# Patient Record
Sex: Female | Born: 1945
Health system: Southern US, Community
[De-identification: ages and names within clinical notes are randomized; demographics above are authoritative.]

## PROBLEM LIST (undated history)

## (undated) DIAGNOSIS — E785 Hyperlipidemia, unspecified: Secondary | ICD-10-CM

## (undated) DIAGNOSIS — M810 Age-related osteoporosis without current pathological fracture: Secondary | ICD-10-CM

## (undated) DIAGNOSIS — E039 Hypothyroidism, unspecified: Secondary | ICD-10-CM

## (undated) HISTORY — PX: WISDOM TOOTH EXTRACTION: SHX21

---

## 2006-07-03 ENCOUNTER — Encounter: Admission: RE | Admit: 2006-07-03 | Discharge: 2006-07-03 | Payer: Self-pay | Admitting: Internal Medicine

## 2007-07-08 ENCOUNTER — Encounter: Admission: RE | Admit: 2007-07-08 | Discharge: 2007-07-08 | Payer: Self-pay | Admitting: Internal Medicine

## 2008-07-12 ENCOUNTER — Encounter: Admission: RE | Admit: 2008-07-12 | Discharge: 2008-07-12 | Payer: Self-pay | Admitting: Obstetrics and Gynecology

## 2008-07-20 ENCOUNTER — Encounter: Admission: RE | Admit: 2008-07-20 | Discharge: 2008-07-20 | Payer: Self-pay | Admitting: Obstetrics and Gynecology

## 2010-08-06 ENCOUNTER — Encounter: Payer: Self-pay | Admitting: Obstetrics and Gynecology

## 2011-10-03 ENCOUNTER — Ambulatory Visit (HOSPITAL_COMMUNITY)
Admission: RE | Admit: 2011-10-03 | Discharge: 2011-10-03 | Disposition: A | Discharge status: Home / Self Care | Payer: BC Managed Care – PPO | Source: Ambulatory Visit | Attending: Internal Medicine | Admitting: Internal Medicine

## 2011-10-03 DIAGNOSIS — R0602 Shortness of breath: Secondary | ICD-10-CM | POA: Insufficient documentation

## 2011-10-03 MED ORDER — ALBUTEROL SULFATE (5 MG/ML) 0.5% IN NEBU
2.5000 mg | INHALATION_SOLUTION | Freq: Once | RESPIRATORY_TRACT | Status: AC
  Administered 2011-10-03: 2.5 mg via RESPIRATORY_TRACT

## 2013-10-13 ENCOUNTER — Other Ambulatory Visit: Payer: Self-pay | Admitting: Internal Medicine

## 2013-10-13 DIAGNOSIS — Z1231 Encounter for screening mammogram for malignant neoplasm of breast: Secondary | ICD-10-CM

## 2013-10-15 ENCOUNTER — Other Ambulatory Visit: Payer: Self-pay | Admitting: Internal Medicine

## 2013-10-15 DIAGNOSIS — R42 Dizziness and giddiness: Secondary | ICD-10-CM

## 2013-10-15 DIAGNOSIS — Z139 Encounter for screening, unspecified: Secondary | ICD-10-CM

## 2013-10-15 DIAGNOSIS — Z87891 Personal history of nicotine dependence: Secondary | ICD-10-CM

## 2013-10-22 ENCOUNTER — Ambulatory Visit: Payer: BC Managed Care – PPO

## 2013-10-23 ENCOUNTER — Other Ambulatory Visit: Payer: BC Managed Care – PPO

## 2013-10-23 ENCOUNTER — Ambulatory Visit
Admission: RE | Admit: 2013-10-23 | Discharge: 2013-10-23 | Disposition: A | Discharge status: Home / Self Care | Payer: Medicare Other | Source: Ambulatory Visit | Attending: Internal Medicine | Admitting: Internal Medicine

## 2013-10-23 DIAGNOSIS — Z139 Encounter for screening, unspecified: Secondary | ICD-10-CM

## 2013-10-23 DIAGNOSIS — Z87891 Personal history of nicotine dependence: Secondary | ICD-10-CM

## 2013-10-23 DIAGNOSIS — Z1231 Encounter for screening mammogram for malignant neoplasm of breast: Secondary | ICD-10-CM

## 2013-10-23 DIAGNOSIS — R42 Dizziness and giddiness: Secondary | ICD-10-CM

## 2014-10-14 ENCOUNTER — Other Ambulatory Visit: Payer: Self-pay | Admitting: Internal Medicine

## 2014-10-14 DIAGNOSIS — Z1231 Encounter for screening mammogram for malignant neoplasm of breast: Secondary | ICD-10-CM

## 2014-12-09 ENCOUNTER — Ambulatory Visit
Admission: RE | Admit: 2014-12-09 | Discharge: 2014-12-09 | Disposition: A | Discharge status: Home / Self Care | Payer: Medicare Other | Source: Ambulatory Visit | Attending: Internal Medicine | Admitting: Internal Medicine

## 2014-12-09 DIAGNOSIS — Z1231 Encounter for screening mammogram for malignant neoplasm of breast: Secondary | ICD-10-CM

## 2015-03-17 ENCOUNTER — Ambulatory Visit (INDEPENDENT_AMBULATORY_CARE_PROVIDER_SITE_OTHER): Payer: Medicare Other | Admitting: Sports Medicine

## 2015-03-17 ENCOUNTER — Encounter: Payer: Self-pay | Admitting: Sports Medicine

## 2015-03-17 VITALS — BP 92/47 | Ht 62.0 in | Wt 135.0 lb

## 2015-03-17 DIAGNOSIS — M25569 Pain in unspecified knee: Secondary | ICD-10-CM

## 2015-03-17 DIAGNOSIS — M7741 Metatarsalgia, right foot: Secondary | ICD-10-CM | POA: Diagnosis not present

## 2015-03-17 DIAGNOSIS — M25461 Effusion, right knee: Secondary | ICD-10-CM | POA: Diagnosis not present

## 2015-03-17 DIAGNOSIS — M25469 Effusion, unspecified knee: Secondary | ICD-10-CM | POA: Insufficient documentation

## 2015-03-17 DIAGNOSIS — M7742 Metatarsalgia, left foot: Secondary | ICD-10-CM | POA: Diagnosis not present

## 2015-03-17 NOTE — Progress Notes (Signed)
   HPI  CC: rt knee pain/buckling Patient is here w/ complaints of Rt knee pain and buckling. She states she has had pain for year in the affected knee but this worsened this winter. She also reports knee buckling. Buckling is not related to pain, and seems to but more of a "failing" as reported by the patient. She denies any injuries in the past, but has now experienced a handful of falls due to this buckling. Reports some clicking/popping/catching of the Rt knee as well as some nighttime pain.   Objective: BP 92/47 mmHg  Ht  (1.575 m)  Wt 135 lb (61.236 kg)  BMI 24.69 kg/m2 Gen: NAD, alert, cooperative Knee: Valgus presentation (R>L). Small effusion noted in Rt knee. No bony abnormalities. No Baker's cyst appreciated. Palpation normal with no warmth or joint line tenderness or patellar tenderness or condyle tenderness. No TTP along infrapatellar or pes anserine bursas. ROM in flexion of Right knee (135 degrees) and extension (0 degrees), and Left knee flexion (160 degrees) and extension (0 degrees) and lower leg rotation. Ligaments with solid consistent endpoints including ACL, PCL, LCL, MCL bilaterally.  Negative Anterior Drawer/Lachman/Pivot Shift. Negative Mcmurray's and Thessaly. Patellar and quadriceps tendons unremarkable. Hamstring and quadriceps strength significantly weak bilaterally. Right Knee [place]: suprapatellar compressible effusion present. Medial and lateral degenerative meniscal evidence present. Some osteophyte growth noted at the lateral joint line.  Neuro: Alert and oriented, Speech clear, No gross deficits  Assessment and plan:  Effusion of knee joint Patient's symptoms and US findings most consistent with degenerative meniscal changes and bony changes typical for her age group. Patient's bilateral weakness of the quadricepts and hamstring muscles is likely helping to contribute to some of this chronic irritation and pain. Small effusion present in affected knee is also a  likely contributor to her discomfort.  - Compressive sleeve provided today for Rt knee.  - Patient given quadricept strengthening exercises to be performed daily.  - Patient encouraged to ice and elevate when sore, but continue staying active.  - Arch support also provided.  Metatarsalgia of both feet Patient noted pain of the distal aspect of the 2nd/3rd metatarsals. This is relatively new onset. Some evidence noted of early signs of transverse arch breakdown. - Arch support provided today. - Patient is likely a good candidate for padding to be placed in the forefoot, in the future.     Kathee Delton, MD,MS,  PGY2 03/17/2015 1:10 PM

## 2015-03-17 NOTE — Assessment & Plan Note (Addendum)
Patient's symptoms and US findings most consistent with degenerative meniscal changes and bony changes typical for her age group. Patient's bilateral weakness of the quadricepts and hamstring muscles is likely helping to contribute to some of this chronic irritation and pain. Small effusion present in affected knee is also a likely contributor to her discomfort.  - Compressive sleeve provided today for Rt knee.  - Patient given quadricept strengthening exercises to be performed daily.  - Patient encouraged to ice and elevate when sore, but continue staying active.  - Arch support also provided.

## 2015-03-17 NOTE — Patient Instructions (Signed)
It was a pleasure seeing you today in our clinic. Today we discussed your knee pain. Here is the treatment plan we have discussed and agreed upon together:   - We would like to see you back in 2 months to track your progression. - In the mean time we would like you to begin a regular strengthening exercise program. These exercises were listed on the separate sheet provided to you during your visit. We ask that you perform these 3-4 times a day everyday.

## 2015-03-17 NOTE — Assessment & Plan Note (Signed)
Patient noted pain of the distal aspect of the 2nd/3rd metatarsals. This is relatively new onset. Some evidence noted of early signs of transverse arch breakdown. - Arch support provided today. - Patient is likely a good candidate for padding to be placed in the forefoot, in the future.

## 2015-03-23 ENCOUNTER — Ambulatory Visit: Payer: Medicare Other | Admitting: Sports Medicine

## 2015-05-17 ENCOUNTER — Encounter: Payer: Self-pay | Admitting: Sports Medicine

## 2015-05-17 ENCOUNTER — Ambulatory Visit (INDEPENDENT_AMBULATORY_CARE_PROVIDER_SITE_OTHER): Payer: Medicare Other | Admitting: Sports Medicine

## 2015-05-17 VITALS — BP 111/84 | HR 69

## 2015-05-17 DIAGNOSIS — M7742 Metatarsalgia, left foot: Secondary | ICD-10-CM

## 2015-05-17 DIAGNOSIS — M7741 Metatarsalgia, right foot: Secondary | ICD-10-CM

## 2015-05-17 DIAGNOSIS — M25461 Effusion, right knee: Secondary | ICD-10-CM

## 2015-05-17 NOTE — Assessment & Plan Note (Signed)
Improved with compression and with some home exercises  Keep up HEP  Use compression and can consider for left knee which has milder sxs  Reck 3 mos  Standing XRays if this worsens

## 2015-05-17 NOTE — Progress Notes (Signed)
Patient ID: Crystal Trujillo, female   DOB: 1945-12-30, 69 y.o.   MRN: [id number]  Patient works as a CMA She is RTC FU of RT knee pain and bilat forefoot pain Arch supports with scaphoid pad have helped feet Still with some forefoot pain  RT knee was giving out Since use of compression sleeve she feels this is 90% better Swelling is less More conficent walkign and steps  Still fears leading up steps with RT leg  ROS No swelling of great toe/ no swelling of other joints No recent medical ills Night pain in joints is rare  PEXAM NAD BP 111/84 mmHg  Pulse 69  RT Knee Mild effusion still present Has spurring medial joint line Improved flexion to 135 deg Ext is full Ligaments stable Strength moderately reduced quads Mod reduced hip abduction and flexion  Poin over lateral MT heads 3 to 5 RT and LT Drop of 4th MT head Loss of longitudinal arch bilat  Pronated gait on walking

## 2015-05-17 NOTE — Patient Instructions (Signed)
Try to do straight leg raises - do 2 sets of 8 to 10  And lateral leg raises - 2 sets of 8 to 10  Then isometrics - 5 repeats for count of 10  Try using compression on both knees  Test the metatarsal pads in your shoes - if helpful we should put in other shoes  See me in about 3 months

## 2015-05-17 NOTE — Assessment & Plan Note (Signed)
MT pads added to insoles  She felt comfortable in these  Scaphoid pad helps correct the pronation  Test these and we should add to all shoes if needed

## 2016-02-02 ENCOUNTER — Other Ambulatory Visit: Payer: Self-pay | Admitting: Internal Medicine

## 2016-02-02 DIAGNOSIS — Z1231 Encounter for screening mammogram for malignant neoplasm of breast: Secondary | ICD-10-CM

## 2016-02-09 ENCOUNTER — Ambulatory Visit
Admission: RE | Admit: 2016-02-09 | Discharge: 2016-02-09 | Disposition: A | Discharge status: Home / Self Care | Payer: Medicare Other | Source: Ambulatory Visit | Attending: Internal Medicine | Admitting: Internal Medicine

## 2016-02-09 DIAGNOSIS — Z1231 Encounter for screening mammogram for malignant neoplasm of breast: Secondary | ICD-10-CM

## 2016-03-17 ENCOUNTER — Encounter (HOSPITAL_COMMUNITY): Payer: Self-pay | Admitting: Emergency Medicine

## 2016-03-17 ENCOUNTER — Emergency Department (HOSPITAL_COMMUNITY): Payer: Medicare Other

## 2016-03-17 ENCOUNTER — Inpatient Hospital Stay (HOSPITAL_COMMUNITY)
Admission: EM | Admit: 2016-03-17 | Discharge: 2016-03-22 | DRG: 460 | Disposition: A | Discharge status: Skilled Nursing Facility | Payer: Medicare Other | Attending: Internal Medicine | Admitting: Internal Medicine

## 2016-03-17 DIAGNOSIS — S32030A Wedge compression fracture of third lumbar vertebra, initial encounter for closed fracture: Secondary | ICD-10-CM | POA: Diagnosis not present

## 2016-03-17 DIAGNOSIS — S32039A Unspecified fracture of third lumbar vertebra, initial encounter for closed fracture: Secondary | ICD-10-CM

## 2016-03-17 DIAGNOSIS — M810 Age-related osteoporosis without current pathological fracture: Secondary | ICD-10-CM | POA: Diagnosis present

## 2016-03-17 DIAGNOSIS — D62 Acute posthemorrhagic anemia: Secondary | ICD-10-CM | POA: Diagnosis not present

## 2016-03-17 DIAGNOSIS — W11XXXA Fall on and from ladder, initial encounter: Secondary | ICD-10-CM | POA: Diagnosis not present

## 2016-03-17 DIAGNOSIS — S32031A Stable burst fracture of third lumbar vertebra, initial encounter for closed fracture: Principal | ICD-10-CM | POA: Diagnosis present

## 2016-03-17 DIAGNOSIS — Z418 Encounter for other procedures for purposes other than remedying health state: Secondary | ICD-10-CM

## 2016-03-17 DIAGNOSIS — Z7983 Long term (current) use of bisphosphonates: Secondary | ICD-10-CM

## 2016-03-17 DIAGNOSIS — E039 Hypothyroidism, unspecified: Secondary | ICD-10-CM | POA: Diagnosis not present

## 2016-03-17 DIAGNOSIS — Z419 Encounter for procedure for purposes other than remedying health state, unspecified: Secondary | ICD-10-CM

## 2016-03-17 DIAGNOSIS — F419 Anxiety disorder, unspecified: Secondary | ICD-10-CM | POA: Diagnosis present

## 2016-03-17 DIAGNOSIS — K5900 Constipation, unspecified: Secondary | ICD-10-CM | POA: Diagnosis present

## 2016-03-17 DIAGNOSIS — D649 Anemia, unspecified: Secondary | ICD-10-CM | POA: Diagnosis present

## 2016-03-17 DIAGNOSIS — E785 Hyperlipidemia, unspecified: Secondary | ICD-10-CM | POA: Diagnosis present

## 2016-03-17 DIAGNOSIS — F329 Major depressive disorder, single episode, unspecified: Secondary | ICD-10-CM | POA: Diagnosis present

## 2016-03-17 DIAGNOSIS — Y92018 Other place in single-family (private) house as the place of occurrence of the external cause: Secondary | ICD-10-CM

## 2016-03-17 DIAGNOSIS — M62838 Other muscle spasm: Secondary | ICD-10-CM | POA: Diagnosis present

## 2016-03-17 DIAGNOSIS — E876 Hypokalemia: Secondary | ICD-10-CM | POA: Diagnosis present

## 2016-03-17 DIAGNOSIS — M5416 Radiculopathy, lumbar region: Secondary | ICD-10-CM | POA: Diagnosis present

## 2016-03-17 HISTORY — DX: Hyperlipidemia, unspecified: E78.5

## 2016-03-17 HISTORY — DX: Hypothyroidism, unspecified: E03.9

## 2016-03-17 HISTORY — DX: Age-related osteoporosis without current pathological fracture: M81.0

## 2016-03-17 LAB — CBC WITH DIFFERENTIAL/PLATELET
Basophils Absolute: 0 10*3/uL (ref 0.0–0.1)
Basophils Relative: 0 %
EOS ABS: 0 10*3/uL (ref 0.0–0.7)
EOS PCT: 0 %
HCT: 39.8 % (ref 36.0–46.0)
Hemoglobin: 13.1 g/dL (ref 12.0–15.0)
LYMPHS ABS: 1 10*3/uL (ref 0.7–4.0)
Lymphocytes Relative: 11 %
MCH: 30.5 pg (ref 26.0–34.0)
MCHC: 32.9 g/dL (ref 30.0–36.0)
MCV: 92.8 fL (ref 78.0–100.0)
MONOS PCT: 6 %
Monocytes Absolute: 0.6 10*3/uL (ref 0.1–1.0)
Neutro Abs: 8.2 10*3/uL — ABNORMAL HIGH (ref 1.7–7.7)
Neutrophils Relative %: 83 %
PLATELETS: 254 10*3/uL (ref 150–400)
RBC: 4.29 MIL/uL (ref 3.87–5.11)
RDW: 13.2 % (ref 11.5–15.5)
WBC: 9.9 10*3/uL (ref 4.0–10.5)

## 2016-03-17 LAB — BASIC METABOLIC PANEL
Anion gap: 5 (ref 5–15)
BUN: 14 mg/dL (ref 6–20)
CHLORIDE: 106 mmol/L (ref 101–111)
CO2: 24 mmol/L (ref 22–32)
CREATININE: 0.65 mg/dL (ref 0.44–1.00)
Calcium: 8.8 mg/dL — ABNORMAL LOW (ref 8.9–10.3)
GFR calc Af Amer: >60 mL/min (ref >60)
GFR calc non Af Amer: >60 mL/min (ref >60)
GLUCOSE: 120 mg/dL — AB (ref 65–99)
Potassium: 4.1 mmol/L (ref 3.5–5.1)
SODIUM: 135 mmol/L (ref 135–145)

## 2016-03-17 MED ORDER — ATORVASTATIN CALCIUM 40 MG PO TABS
40.0000 mg | ORAL_TABLET | Freq: Every day | ORAL | Status: DC
  Administered 2016-03-19 – 2016-03-21 (×3): 40 mg via ORAL
  Filled 2016-03-17 (×3): qty 1

## 2016-03-17 MED ORDER — CYCLOBENZAPRINE HCL 10 MG PO TABS
10.0000 mg | ORAL_TABLET | Freq: Once | ORAL | Status: AC
  Administered 2016-03-17: 10 mg via ORAL
  Filled 2016-03-17: qty 1

## 2016-03-17 MED ORDER — MORPHINE SULFATE (PF) 2 MG/ML IV SOLN
2.0000 mg | INTRAVENOUS | Status: DC | PRN
  Administered 2016-03-17 – 2016-03-20 (×8): 2 mg via INTRAVENOUS
  Filled 2016-03-17 (×8): qty 1

## 2016-03-17 MED ORDER — LEVOTHYROXINE SODIUM 75 MCG PO TABS
75.0000 ug | ORAL_TABLET | Freq: Every evening | ORAL | Status: DC
  Administered 2016-03-17 – 2016-03-21 (×3): 75 ug via ORAL
  Filled 2016-03-17 (×4): qty 1

## 2016-03-17 MED ORDER — CYCLOBENZAPRINE HCL 10 MG PO TABS
5.0000 mg | ORAL_TABLET | Freq: Once | ORAL | Status: AC
  Administered 2016-03-17: 5 mg via ORAL
  Filled 2016-03-17: qty 1

## 2016-03-17 MED ORDER — HYDROMORPHONE HCL 1 MG/ML IJ SOLN
1.0000 mg | Freq: Once | INTRAMUSCULAR | Status: AC
Start: 2016-03-17 — End: 2016-03-17
  Administered 2016-03-17: 1 mg via INTRAMUSCULAR
  Filled 2016-03-17: qty 1

## 2016-03-17 MED ORDER — ENOXAPARIN SODIUM 40 MG/0.4ML ~~LOC~~ SOLN
40.0000 mg | SUBCUTANEOUS | Status: DC
  Administered 2016-03-17 – 2016-03-21 (×5): 40 mg via SUBCUTANEOUS
  Filled 2016-03-17 (×5): qty 0.4

## 2016-03-17 MED ORDER — CYCLOBENZAPRINE HCL 5 MG PO TABS: 5.0000 mg | ORAL_TABLET | Freq: Two times a day (BID) | ORAL | 0 refills | Status: AC | PRN

## 2016-03-17 MED ORDER — SODIUM CHLORIDE 0.9 % IV SOLN
INTRAVENOUS | Status: DC
  Administered 2016-03-17: via INTRAVENOUS

## 2016-03-17 MED ORDER — CITALOPRAM HYDROBROMIDE 20 MG PO TABS: 20.0000 mg | ORAL_TABLET | Freq: Every day | ORAL | Status: DC | PRN

## 2016-03-17 MED ORDER — HYDROCODONE-ACETAMINOPHEN 5-325 MG PO TABS: 1.0000 | ORAL_TABLET | Freq: Four times a day (QID) | ORAL | 0 refills | Status: DC | PRN

## 2016-03-17 MED ORDER — ALENDRONATE SODIUM 70 MG PO TABS: 70.0000 mg | ORAL_TABLET | ORAL | Status: DC

## 2016-03-17 MED ORDER — HYDROCODONE-ACETAMINOPHEN 5-325 MG PO TABS
1.0000 | ORAL_TABLET | Freq: Once | ORAL | Status: AC
  Administered 2016-03-17: 1 via ORAL
  Filled 2016-03-17: qty 1

## 2016-03-17 MED ORDER — ONDANSETRON HCL 4 MG PO TABS: 4.0000 mg | ORAL_TABLET | Freq: Four times a day (QID) | ORAL | Status: DC | PRN

## 2016-03-17 MED ORDER — PANTOPRAZOLE SODIUM 40 MG PO TBEC
40.0000 mg | DELAYED_RELEASE_TABLET | Freq: Every day | ORAL | Status: DC
  Administered 2016-03-19 – 2016-03-22 (×4): 40 mg via ORAL
  Filled 2016-03-17 (×4): qty 1

## 2016-03-17 MED ORDER — ONDANSETRON HCL 4 MG/2ML IJ SOLN: 4.0000 mg | Freq: Four times a day (QID) | INTRAMUSCULAR | Status: DC | PRN

## 2016-03-17 MED ORDER — HYDROCODONE-ACETAMINOPHEN 5-325 MG PO TABS
1.0000 | ORAL_TABLET | ORAL | Status: DC | PRN
  Administered 2016-03-18: 1 via ORAL
  Filled 2016-03-17: qty 1

## 2016-03-17 MED ORDER — TRAZODONE HCL 50 MG PO TABS
50.0000 mg | ORAL_TABLET | Freq: Every day | ORAL | Status: DC
Start: 2016-03-17 — End: 2016-03-22
  Administered 2016-03-17 – 2016-03-21 (×5): 50 mg via ORAL
  Filled 2016-03-17 (×5): qty 1

## 2016-03-17 MED ORDER — CYCLOBENZAPRINE HCL 5 MG PO TABS
7.5000 mg | ORAL_TABLET | Freq: Three times a day (TID) | ORAL | Status: DC | PRN
  Filled 2016-03-17: qty 1.5

## 2016-03-17 NOTE — ED Notes (Signed)
[employer] rep placed TLSO brace on pt with assistance from [employer].

## 2016-03-17 NOTE — ED Notes (Signed)
Notified Ortho tech that pt needs TLSO. Ortho tech informed this RN he has to call [employer] to have device delivered. Ortho tech will call and start process

## 2016-03-17 NOTE — ED Notes (Signed)
Dr. Stern at bedside

## 2016-03-17 NOTE — ED Provider Notes (Signed)
Blood pressure 123/58, pulse 83, temperature 98.2 F (36.8 C), temperature source Oral, resp. rate 15, height 5\' 1"  (1.549 m), weight 134 lb (60.8 kg), SpO2 99 %.  Assuming care from Dr. Freida BusmanAllen and Fayrene HelperBowie Tran.  In short, Crystal Trujillo is a 70 y.o. female with a chief complaint of Fall .  Refer to the original H&P for additional details.  The current plan of care is to follow with NSG regarding L spine fracture.   06:49 PM Patient still with considerable active pain with any motion. She lives alone. Discussed the case with neurosurgery who has reviewed the images. She has TLSO brace in place. Plan for admission for pain control and physical therapy.   Spoke with medicine team regarding admission. They will be down to see the patient and place orders for admission. Ordered additional flexeril with muscle spasm pain.   Alona BeneJoshua Jesica Goheen, MD   Maia PlanJoshua G Mahayla Haddaway, MD 03/17/16 [phone]

## 2016-03-17 NOTE — Care Management Note (Signed)
Case Management Note  Patient Details  Name: Crystal Trujillo MRN: [id number] Date of Birth: 26-Feb-1946  Subjective/Objective:   CM received Phone call @1814  03/17/2016,  [facility]. CM off site and en route to [facility], arrived 1900 and retrieved Phone message 2004.  Reviewed chart.  Pt has L3 fx and is to be admitted for pain control. Will defer DME to Inpt CM closer to discharge. (no DME rep on sight at this time)  No further CM needs at this time. I will leave note for MD that this pt will need orders and [id number], as this pt has Medicare as payor.                 Action/Plan: CM will continue to follow as inpt.    Expected Discharge Date:                  Expected Discharge Plan:  Home/Self Care  In-House Referral:  NA  Discharge planning Services  CM Consult  Post Acute Care Choice:  Durable Medical Equipment Choice offered to:     DME Arranged:    DME Agency:     HH Arranged:    HH Agency:     Status of Service:  In process, will continue to follow  If discussed at Long Length of Stay Meetings, dates discussed:    Additional Comments:  Yvone NeuCrutchfield, Esmeralda Blanford M, RN 03/17/2016, 8:06 PM

## 2016-03-17 NOTE — ED Provider Notes (Signed)
[facility] DEPT Provider Note   CSN: [id number] Arrival date & time: 03/17/16  1205     History   Chief Complaint Chief Complaint  Patient presents with  . Fall    HPI Crystal Trujillo is a 70 y.o. female.  HPI   70 year old female with history of osteoporosis brought here via EMS from home for evaluation of a recent fall. Patient reported approximately an hour and half ago she was standing on a 6 foot ladder trying to change the smoke detector on the ceiling. She was standing on the second to top rung. She was maneuvering her body she lost control, "ricocheted off the wall" and landed on her bottom. She denies hitting her head or loss of consciousness but does report moderate sharp shooting pain from her low back radiates up to her mid back and down to her buttock. She states her pain is minimal without movement but when she twisted her pain intensified. She denies any headache, neck pain, chest pain, difficulty breathing, abdominal pain, pain to her extremities. She is not on blood thinner medication. No specific treatment tried. Denies any new numbness. Does have history of osteoporosis and currently taking Fosamax. She denies any precipitating symptoms prior to the fall. She was brought here on a spine board.  Past Medical History:  Diagnosis Date  . Hyperlipidemia   . Osteoporosis     Patient Active Problem List   Diagnosis Date Noted  . Effusion of knee joint 03/17/2015  . Metatarsalgia of both feet 03/17/2015    History reviewed. No pertinent surgical history.  OB History    No data available       Home Medications    Prior to Admission medications   Medication Sig Start Date End Date Taking? Authorizing Provider  alendronate (FOSAMAX) 70 MG tablet  03/08/15   Historical Provider, MD  atorvastatin (LIPITOR) 40 MG tablet  04/22/15   Historical Provider, MD  citalopram (CELEXA) 20 MG tablet  04/08/15   Historical Provider, MD  levothyroxine (SYNTHROID, LEVOTHROID) 75  MCG tablet  03/07/15   Historical Provider, MD  traZODone (DESYREL) 50 MG tablet  03/29/15   Historical Provider, MD    Family History History reviewed. No pertinent family history.  Social History Social History  Substance Use Topics  . Smoking status: Never Smoker  . Smokeless tobacco: Never Used  . Alcohol use No     Allergies   Penicillins and Sulfa antibiotics   Review of Systems Review of Systems  Constitutional: Negative for fever.  Gastrointestinal: Negative for abdominal pain.  Genitourinary: Positive for pelvic pain.  Musculoskeletal: Positive for back pain.  Skin: Negative for wound.  Neurological: Negative for numbness.     Physical Exam Updated Vital Signs BP 118/60 (BP Location: Right Arm) Comment: Simultaneous filing. User may not have seen previous data.  Pulse 75   Temp 98.2 F (36.8 C) (Oral) Comment: Simultaneous filing. User may not have seen previous data. Comment (Src): Simultaneous filing. User may not have seen previous data.  Resp 15   Ht 5\' 1"  (1.549 m)   Wt 60.8 kg   SpO2 100%   BMI 25.32 kg/m   Physical Exam  Constitutional: She is oriented to person, place, and time. She appears well-developed and well-nourished. No distress.  Well appearing Female laying on spine board in no acute discomfort.  HENT:  Head: Normocephalic and atraumatic.  No scalp tenderness  Eyes: Conjunctivae are normal.  Neck: Neck supple.  Cardiovascular: Normal rate and  regular rhythm.   Pulmonary/Chest: Effort normal and breath sounds normal.  Abdominal: Soft. There is no tenderness.  Musculoskeletal: She exhibits tenderness ( tenderness to lumbar spinal (L2-S1) para spinal muscle on palpation and tenderness along the buttock region without any obvious bruising. No crepitus or step-off noted on midline spine).  No pain to all 4 extremities  Neurological: She is alert and oriented to person, place, and time.  Skin: No rash noted.  Psychiatric: She has a normal  mood and affect.  Nursing note and vitals reviewed.    ED Treatments / Results  Labs (all labs ordered are listed, but only abnormal results are displayed) Labs Reviewed - No data to display  EKG  EKG Interpretation None       Radiology Dg Lumbar Spine Complete  Result Date: 03/17/2016 CLINICAL DATA:  Pt states that she fell off of a 5 foot ladder this morning while trying to change the battery in the smoke detector. When she fell backwards she hit her back on the wall and fell to floor landing on her side. Pain in lower back and pelvis. Pain comes and goes and is excruciating. Limited mobility. EXAM: LUMBAR SPINE - COMPLETE 4+ VIEW COMPARISON:  None. FINDINGS: Acute fracture of L3. There is proximally 40% loss of anterior height. No definite retropulsion of fracture fragments. However, detail is limited given the portable nature of the exam. The fractures are identified. No spondylolisthesis. IMPRESSION: Acute fracture of L3. Electronically Signed   By: Norva PavlovElizabeth  Brown M.D.   On: 03/17/2016 13:32   Ct Lumbar Spine Wo Contrast  Result Date: 03/17/2016 CLINICAL DATA:  Fall 15 feet from a ladder. Low back pain. L3 fracture on radiographs. EXAM: CT LUMBAR SPINE WITHOUT CONTRAST TECHNIQUE: Multidetector CT imaging of the lumbar spine was performed without intravenous contrast administration. Multiplanar CT image reconstructions were also generated. COMPARISON:  Lumbar spine radiographs earlier today FINDINGS: Lumbar segmentation is normal. As seen on earlier radiographs, there is an L3 vertebral body burst fracture with approximately 60% height loss centrally. Posterior vertebral body fragments are retropulsed approximately 6 mm into the canal with resultant mild to moderate spinal stenosis. No posterior element fracture is identified. Vertebral body heights are preserved elsewhere in the lumbar spine without additional fractures identified. Intervertebral disc space heights are preserved. There  is no significant listhesis. Multilevel facet arthrosis is noted, moderate at L5-S1. Abdominal aortic atherosclerosis is noted. IMPRESSION: 1. L3 burst fracture with 60% height loss, 6 mm retropulsion, and mild-to-moderate spinal stenosis. 2. Aortic atherosclerosis. Electronically Signed   By: Sebastian AcheAllen  Grady M.D.   On: 03/17/2016 16:10   Dg Hips Bilat W Or Wo Pelvis 3-4 Views  Result Date: 03/17/2016 CLINICAL DATA:  Pt states that she fell off of a 5 foot ladder this morning while trying to change the battery in the smoke detector. When she fell backwards she hit her back on the wall and fell to floor landing on her side. Pain in the lower back and pelvis comes and goes and is excruciating. EXAM: DG HIP (WITH OR WITHOUT PELVIS) 3-4V BILAT COMPARISON:  Lumbar spine same day FINDINGS: There is no evidence of hip fracture or dislocation. There is no evidence of arthropathy or other focal bone abnormality. IMPRESSION: Negative. Electronically Signed   By: Norva PavlovElizabeth  Brown M.D.   On: 03/17/2016 13:34    Procedures Procedures (including critical care time)  Medications Ordered in ED Medications - No data to display   Initial Impression / Assessment and Plan /  ED Course  I have reviewed the triage vital signs and the nursing notes.  Pertinent labs & imaging results that were available during my care of the patient were reviewed by me and considered in my medical decision making (see chart for details).  Clinical Course    BP 130/71   Pulse 79   Temp 98.2 F (36.8 C) (Oral) Comment: Simultaneous filing. User may not have seen previous data. Comment (Src): Simultaneous filing. User may not have seen previous data.  Resp 15   Ht 5\' 1"  (1.549 m)   Wt 60.8 kg   SpO2 99%   BMI 25.32 kg/m    Final Clinical Impressions(s) / ED Diagnoses   Final diagnoses:  L3 vertebral fracture, closed, initial encounter    New Prescriptions New Prescriptions   CYCLOBENZAPRINE (FLEXERIL) 5 MG TABLET    Take 1  tablet (5 mg total) by mouth 2 (two) times daily as needed for muscle spasms.   HYDROCODONE-ACETAMINOPHEN (NORCO/VICODIN) 5-325 MG TABLET    Take 1 tablet by mouth every 6 (six) hours as needed for moderate pain.   12:37 PM Patient had a mechanical fall, falling off a ladder at approximately 4-5 feett in height. She has low back pain and pelvic pain on exam without any obvious deformity. Sensation is intact throughout. Will obtain x-rays for further evaluation. Pain medication given.  2:02 PM L-spine x-ray demonstrate an acute fracture of L3 with 40% loss of anterior height. X-ray result is limited due to the portable nature of the exam. Therefore, I will obtain an L-spine CT scan for more thorough evaluation. Care discussed with patient and with Dr. Freida Busman. Patient's pain is currently controlled. She has no loss of sensation to her lower extremities to suggest spinal cord injury. TLSO brace ordered.  5:01 PM Currently awaiting neurosurgeon Dr. Venetia Maxon to call back to report about the CT result.  Care discussed with Dr. Jacqulyn Bath who will consult DR. Venetia Maxon and dispo pending recommendation.     Fayrene Helper, PA-C 03/17/16 1702    Lorre Nick, MD 03/19/16 [phone]

## 2016-03-17 NOTE — ED Triage Notes (Signed)
Pt on 6 foot ladder standing on second from top step. Pt fell 365ft. Pt denies LOC.  Pt complaining on right leg and lower back pain. No deformities per EMS. Pt denies neck pain. Lungs clear per EMS. BP 112/70. HR 76, resp 16, 96% on room air.

## 2016-03-17 NOTE — ED Notes (Signed)
Dr. Smith at bedside.

## 2016-03-17 NOTE — ED Notes (Signed)
[employer] rep at bedside to place brace on pt

## 2016-03-17 NOTE — H&P (Signed)
History and Physical    Crystal Trujillo [id number] DOB: Nov 23, 1945 DOA: 03/17/2016  Referring MD/NP/PA: Dr. Jacqulyn BathLong PCP: Gaspar Garbeichard W Tisovec, MD  Patient coming from: From home patient lives alone    Chief Complaint: Fall  HPI: Crystal Trujillo is a 70 y.o. female with medical history significant of osteoporosis, hypothyroidism, and HLD; who presents after having a fall at home. Patient reports that she was standing on a ladder to changing out smoke detector batteries this afternoon in the hallway of her home when she lost her footing and fell. She states that her body "ricocheted" against the wall and slid down onto her bottom. She denies any significant loss of consciousness. Within 10 seconds she knew this was not ordered on and states admits having intense pain for which she broke out in a sweat. She states that it took approximately 2 hours for her to be able to get help. Neighbors nearby heard her yelling and came in and called 911. At this time she reports pains shooting down her right leg with accompanying muscle spasms. She reports that her back doesn't hurt so much but the surrounding areas from the fall.  ED Course: Upon admission into the emergency room the patient was seen to be afebrile with vital signs within normal limits. Imaging studies revealed a L3 burst fracture with 60% vertebral height loss. Dr. Venetia MaxonStern of Neurosurgery was consulted and evaluated the patient in the emergency room. She was fitted for a TLSO brace. [facility] called to admit for pain control and physical therapy.  Review of Systems: As per HPI otherwise 10 point review of systems negative.   Past Medical History:  Diagnosis Date  . Hyperlipidemia   . Osteoporosis     History reviewed. No pertinent surgical history.   reports that she has never smoked. She has never used smokeless tobacco. She reports that she does not drink alcohol or use drugs.  Allergies  Allergen Reactions  . Latex Anaphylaxis    From paint, Latex  gloves are OK  . Penicillins Anaphylaxis    Brother and sister had anaphylactic reaction, near death.  Put on as a precaution Has patient had a PCN reaction causing immediate rash, facial/tongue/throat swelling, SOB or lightheadedness with hypotension: No Has patient had a PCN reaction causing severe rash involving mucus membranes or skin necrosis: No Has patient had a PCN reaction that required hospitalization No Has patient had a PCN reaction occurring within the last 10 years: No If all of the above answers are "NO", then may proceed with   . Lactose Intolerance (Gi) Nausea And Vomiting    Cultured cheese  . Sulfa Antibiotics Hives    Massive, painful    History reviewed. No pertinent family history.  Prior to Admission medications   Medication Sig Start Date End Date Taking? Authorizing Provider  alendronate (FOSAMAX) 70 MG tablet Take 70 mg by mouth every Saturday.  03/08/15  Yes Historical Provider, MD  atorvastatin (LIPITOR) 40 MG tablet Take 40 mg by mouth daily at 6 PM.  04/22/15  Yes Historical Provider, MD  citalopram (CELEXA) 20 MG tablet Take 20 mg by mouth daily as needed (for depression).  04/08/15  Yes Historical Provider, MD  levothyroxine (SYNTHROID, LEVOTHROID) 75 MCG tablet Take 75 mcg by mouth every evening.  03/07/15  Yes Historical Provider, MD  naproxen sodium (ANAPROX) 220 MG tablet Take 660 mg by mouth daily at 12 noon.   Yes Historical Provider, MD  traZODone (DESYREL) 50 MG tablet Take 50  mg by mouth at bedtime.  03/29/15  Yes Historical Provider, MD  cyclobenzaprine (FLEXERIL) 5 MG tablet Take 1 tablet (5 mg total) by mouth 2 (two) times daily as needed for muscle spasms. 03/17/16   Fayrene Helper, PA-C  HYDROcodone-acetaminophen (NORCO/VICODIN) 5-325 MG tablet Take 1 tablet by mouth every 6 (six) hours as needed for moderate pain. 03/17/16   Fayrene Helper, PA-C    Physical Exam:    Constitutional:  Vitals:   03/17/16 1615 03/17/16 1700 03/17/16 1745 03/17/16 1830  BP:  130/71 118/59 123/58 117/67  Pulse: 79 72 83 78  Resp:      Temp:      TempSrc:      SpO2: 99% 96% 99% 96%  Weight:      Height:       Eyes: PERRL, lids and conjunctivae normal ENMT: Mucous membranes are moist. Posterior pharynx clear of any exudate or lesions.Normal dentition.  Neck: normal, supple, no masses, no thyromegaly Respiratory: clear to auscultation bilaterally, no wheezing, no crackles. Normal respiratory effort. No accessory muscle use.  Cardiovascular: Regular rate and rhythm, no murmurs / rubs / gallops. No extremity edema. 2+ pedal pulses. No carotid bruits.  Abdomen: no tenderness, no masses palpated. No hepatosplenomegaly. Bowel sounds positive.  Musculoskeletal: no clubbing / cyanosis. No joint deformity upper and lower extremities. Good ROM, no contractures. Normal muscle tone.  Skin: no rashes, lesions, ulcers. No induration Neurologic: CN 2-12 grossly intact. Sensation intact, DTR normal. Strength 5/5 in all 4.  Psychiatric: Normal judgment and insight. Alert and oriented x 3. Normal mood.     Labs on Admission: I have personally reviewed following labs and imaging studies  CBC: No results for input(s): WBC, NEUTROABS, HGB, HCT, MCV, PLT in the last 168 hours. Basic Metabolic Panel: No results for input(s): NA, K, CL, CO2, GLUCOSE, BUN, CREATININE, CALCIUM, MG, PHOS in the last 168 hours. GFR: CrCl cannot be calculated (No order found.). Liver Function Tests: No results for input(s): AST, ALT, ALKPHOS, BILITOT, PROT, ALBUMIN in the last 168 hours. No results for input(s): LIPASE, AMYLASE in the last 168 hours. No results for input(s): AMMONIA in the last 168 hours. Coagulation Profile: No results for input(s): INR, PROTIME in the last 168 hours. Cardiac Enzymes: No results for input(s): CKTOTAL, CKMB, CKMBINDEX, TROPONINI in the last 168 hours. BNP (last 3 results) No results for input(s): PROBNP in the last 8760 hours. HbA1C: No results for input(s):  HGBA1C in the last 72 hours. CBG: No results for input(s): GLUCAP in the last 168 hours. Lipid Profile: No results for input(s): CHOL, HDL, LDLCALC, TRIG, CHOLHDL, LDLDIRECT in the last 72 hours. Thyroid Function Tests: No results for input(s): TSH, T4TOTAL, FREET4, T3FREE, THYROIDAB in the last 72 hours. Anemia Panel: No results for input(s): VITAMINB12, FOLATE, FERRITIN, TIBC, IRON, RETICCTPCT in the last 72 hours. Urine analysis: No results found for: COLORURINE, APPEARANCEUR, LABSPEC, PHURINE, GLUCOSEU, HGBUR, BILIRUBINUR, KETONESUR, PROTEINUR, UROBILINOGEN, NITRITE, LEUKOCYTESUR Sepsis Labs: No results found for this or any previous visit (from the past 240 hour(s)).   Radiological Exams on Admission: Dg Lumbar Spine Complete  Result Date: 03/17/2016 CLINICAL DATA:  Pt states that she fell off of a 5 foot ladder this morning while trying to change the battery in the smoke detector. When she fell backwards she hit her back on the wall and fell to floor landing on her side. Pain in lower back and pelvis. Pain comes and goes and is excruciating. Limited mobility. EXAM: LUMBAR SPINE -  COMPLETE 4+ VIEW COMPARISON:  None. FINDINGS: Acute fracture of L3. There is proximally 40% loss of anterior height. No definite retropulsion of fracture fragments. However, detail is limited given the portable nature of the exam. The fractures are identified. No spondylolisthesis. IMPRESSION: Acute fracture of L3. Electronically Signed   By: Norva Pavlov M.D.   On: 03/17/2016 13:32   Ct Lumbar Spine Wo Contrast  Result Date: 03/17/2016 CLINICAL DATA:  Fall 15 feet from a ladder. Low back pain. L3 fracture on radiographs. EXAM: CT LUMBAR SPINE WITHOUT CONTRAST TECHNIQUE: Multidetector CT imaging of the lumbar spine was performed without intravenous contrast administration. Multiplanar CT image reconstructions were also generated. COMPARISON:  Lumbar spine radiographs earlier today FINDINGS: Lumbar segmentation  is normal. As seen on earlier radiographs, there is an L3 vertebral body burst fracture with approximately 60% height loss centrally. Posterior vertebral body fragments are retropulsed approximately 6 mm into the canal with resultant mild to moderate spinal stenosis. No posterior element fracture is identified. Vertebral body heights are preserved elsewhere in the lumbar spine without additional fractures identified. Intervertebral disc space heights are preserved. There is no significant listhesis. Multilevel facet arthrosis is noted, moderate at L5-S1. Abdominal aortic atherosclerosis is noted. IMPRESSION: 1. L3 burst fracture with 60% height loss, 6 mm retropulsion, and mild-to-moderate spinal stenosis. 2. Aortic atherosclerosis. Electronically Signed   By: Sebastian Ache M.D.   On: 03/17/2016 16:10   Dg Hips Bilat W Or Wo Pelvis 3-4 Views  Result Date: 03/17/2016 CLINICAL DATA:  Pt states that she fell off of a 5 foot ladder this morning while trying to change the battery in the smoke detector. When she fell backwards she hit her back on the wall and fell to floor landing on her side. Pain in the lower back and pelvis comes and goes and is excruciating. EXAM: DG HIP (WITH OR WITHOUT PELVIS) 3-4V BILAT COMPARISON:  Lumbar spine same day FINDINGS: There is no evidence of hip fracture or dislocation. There is no evidence of arthropathy or other focal bone abnormality. IMPRESSION: Negative. Electronically Signed   By: Norva Pavlov M.D.   On: 03/17/2016 13:34      Assessment/Plan Fall with L3 fracture: Acute. Patient fell from ladder - Admit to a MedSurg bed - Monitor ins and outs - Pain control - Continue TLSO brace  - Physical therapy to eval and treat. - Continue to monitor for worsening symptoms including increasing leg/ back pain - Appreciate Neurosurgery consultation,  follow-up as needed  Osteoporosis:  - Continue Fosamax  Hypothyroidism - Continue levothyroxine  Depression:  -  Continue Celexa  Hyperlipidemia - Continue atorvastatin  GERD prophylaxis: Protonix DVT prophylaxis: Lovenox   Code Status: Full  Family Communication: No family present at bedside Disposition Plan: TBD Consults called: Dr. Venetia Maxon   Admission status:Observation MedSurg  Clydie Braun MD [facility] Pager [phone]  If 7PM-7AM, please contact night-coverage [email or web address] Password [email or web address]  03/17/2016, 7:11 PM

## 2016-03-17 NOTE — Consult Note (Signed)
Reason for Consult:L 3 fracture Referring Physician: Long  Dhamar Gregory is an 70 y.o. female.  HPI: 70 year old female with history of osteoporosis brought here via EMS from home for evaluation of a recent fall. Patient reported approximately an hour and half ago she was standing on a 6 foot ladder trying to change the smoke detector on the ceiling. She was standing on the second to top rung. She was maneuvering her body she lost control, "ricocheted off the wall" and landed on her bottom. She denies hitting her head or loss of consciousness but does report moderate sharp shooting pain from her low back radiates up to her mid back and down to her buttock. She states her pain is minimal without movement but when she twisted her pain intensified. She denies any headache, neck pain, chest pain, difficulty breathing, abdominal pain, pain to her extremities. She is not on blood thinner medication. No specific treatment tried. Denies any new numbness. Does have history of osteoporosis and currently taking Fosamax. She denies any precipitating symptoms prior to the fall. She was brought here on a spine board.  CT scan demonstrates L 3 vertebral body fracture with 60% vertebral body height loss and 6 mm retropulsion of bone in the spinal canal.  She is having back pain, but denies leg pain.  She has been fitted for TLSO brace.      Past Medical History:     Past Medical History:  Diagnosis Date  . Hyperlipidemia   . Osteoporosis     History reviewed. No pertinent surgical history.  History reviewed. No pertinent family history.  Social History:  reports that she has never smoked. She has never used smokeless tobacco. She reports that she does not drink alcohol or use drugs.  Allergies:  Allergies  Allergen Reactions  . Latex Anaphylaxis    From paint, Latex gloves are OK  . Penicillins Anaphylaxis    Brother and sister had anaphylactic reaction, near death.  Put on as a precaution Has patient  had a PCN reaction causing immediate rash, facial/tongue/throat swelling, SOB or lightheadedness with hypotension: No Has patient had a PCN reaction causing severe rash involving mucus membranes or skin necrosis: No Has patient had a PCN reaction that required hospitalization No Has patient had a PCN reaction occurring within the last 10 years: No If all of the above answers are "NO", then may proceed with   . Lactose Intolerance (Gi) Nausea And Vomiting    Cultured cheese  . Sulfa Antibiotics Hives    Massive, painful    Medications: I have reviewed the patient's current medications.  No results found for this or any previous visit (from the past 48 hour(s)).  Dg Lumbar Spine Complete  Result Date: 03/17/2016 CLINICAL DATA:  Pt states that she fell off of a 5 foot ladder this morning while trying to change the battery in the smoke detector. When she fell backwards she hit her back on the wall and fell to floor landing on her side. Pain in lower back and pelvis. Pain comes and goes and is excruciating. Limited mobility. EXAM: LUMBAR SPINE - COMPLETE 4+ VIEW COMPARISON:  None. FINDINGS: Acute fracture of L3. There is proximally 40% loss of anterior height. No definite retropulsion of fracture fragments. However, detail is limited given the portable nature of the exam. The fractures are identified. No spondylolisthesis. IMPRESSION: Acute fracture of L3. Electronically Signed   By: Norva Pavlov M.D.   On: 03/17/2016 13:32   Ct Lumbar  Spine Wo Contrast  Result Date: 03/17/2016 CLINICAL DATA:  Fall 15 feet from a ladder. Low back pain. L3 fracture on radiographs. EXAM: CT LUMBAR SPINE WITHOUT CONTRAST TECHNIQUE: Multidetector CT imaging of the lumbar spine was performed without intravenous contrast administration. Multiplanar CT image reconstructions were also generated. COMPARISON:  Lumbar spine radiographs earlier today FINDINGS: Lumbar segmentation is normal. As seen on earlier radiographs,  there is an L3 vertebral body burst fracture with approximately 60% height loss centrally. Posterior vertebral body fragments are retropulsed approximately 6 mm into the canal with resultant mild to moderate spinal stenosis. No posterior element fracture is identified. Vertebral body heights are preserved elsewhere in the lumbar spine without additional fractures identified. Intervertebral disc space heights are preserved. There is no significant listhesis. Multilevel facet arthrosis is noted, moderate at L5-S1. Abdominal aortic atherosclerosis is noted. IMPRESSION: 1. L3 burst fracture with 60% height loss, 6 mm retropulsion, and mild-to-moderate spinal stenosis. 2. Aortic atherosclerosis. Electronically Signed   By: Sebastian AcheAllen  Grady M.D.   On: 03/17/2016 16:10   Dg Hips Bilat W Or Wo Pelvis 3-4 Views  Result Date: 03/17/2016 CLINICAL DATA:  Pt states that she fell off of a 5 foot ladder this morning while trying to change the battery in the smoke detector. When she fell backwards she hit her back on the wall and fell to floor landing on her side. Pain in the lower back and pelvis comes and goes and is excruciating. EXAM: DG HIP (WITH OR WITHOUT PELVIS) 3-4V BILAT COMPARISON:  Lumbar spine same day FINDINGS: There is no evidence of hip fracture or dislocation. There is no evidence of arthropathy or other focal bone abnormality. IMPRESSION: Negative. Electronically Signed   By: Norva PavlovElizabeth  Brown M.D.   On: 03/17/2016 13:34    Review of Systems - Negative except As above, known history of osteoporosis    Blood pressure 117/67, pulse 78, temperature 98.2 F (36.8 C), temperature source Oral, resp. rate 15, height 5\' 1"  (1.549 m), weight 60.8 kg (134 lb), SpO2 96 %. Physical Exam  Constitutional: She is oriented to person, place, and time. She appears well-developed and well-nourished.  HENT:  Head: Normocephalic and atraumatic.  Eyes: EOM are normal. Pupils are equal, round, and reactive to light.  Neck:  Normal range of motion.  Cardiovascular: Normal rate and regular rhythm.   Respiratory: Effort normal and breath sounds normal.  GI: Soft. Bowel sounds are normal.  Neurological: She is alert and oriented to person, place, and time. She has normal strength and normal reflexes. No cranial nerve deficit or sensory deficit. GCS eye subscore is 4. GCS verbal subscore is 5. GCS motor subscore is 6.  Patient is complaining of right buttock and thigh pain.  She is complaining of muscular spasms.  Psychiatric: She has a normal mood and affect. Her speech is normal and behavior is normal. Judgment and thought content normal. Cognition and memory are normal.    Assessment/Plan: Patient has L 3 fracture.  Admit for pain control, mobilize in brace with PT. Patient is having right leg pain without weakness.  If this does not resolve, then patient may require surgical stabilization of her spinal fracture.  Will follow.   Dorian HeckleSTERN,Katheryne Gorr D, MD 03/17/2016, 7:13 PM

## 2016-03-17 NOTE — ED Notes (Signed)
Pt in CT.

## 2016-03-17 NOTE — Progress Notes (Signed)
Orthopedic Tech Progress Note Patient Details:  Crystal Trujillo Jul 19, 1945 [id number]  Patient ID: Crystal Trujillo, female   DOB: Jul 19, 1945, 70 y.o.   MRN: [id number]   Crystal Trujillo, Crystal Trujillo 03/17/2016, 2:14 PM Called in bio-tech brace order; spoke with Reita ClicheBobby

## 2016-03-18 ENCOUNTER — Observation Stay (HOSPITAL_COMMUNITY): Payer: Medicare Other

## 2016-03-18 ENCOUNTER — Inpatient Hospital Stay (HOSPITAL_COMMUNITY)
Admission: EM | Disposition: A | Discharge status: Skilled Nursing Facility | Payer: Self-pay | Source: Home / Self Care | Attending: Internal Medicine

## 2016-03-18 ENCOUNTER — Observation Stay (HOSPITAL_COMMUNITY): Payer: Medicare Other | Admitting: Anesthesiology

## 2016-03-18 ENCOUNTER — Encounter (HOSPITAL_COMMUNITY): Payer: Self-pay | Admitting: Certified Registered"

## 2016-03-18 DIAGNOSIS — M810 Age-related osteoporosis without current pathological fracture: Secondary | ICD-10-CM | POA: Diagnosis present

## 2016-03-18 DIAGNOSIS — E039 Hypothyroidism, unspecified: Secondary | ICD-10-CM | POA: Diagnosis present

## 2016-03-18 DIAGNOSIS — E038 Other specified hypothyroidism: Secondary | ICD-10-CM | POA: Diagnosis not present

## 2016-03-18 DIAGNOSIS — F419 Anxiety disorder, unspecified: Secondary | ICD-10-CM | POA: Diagnosis present

## 2016-03-18 DIAGNOSIS — K5900 Constipation, unspecified: Secondary | ICD-10-CM | POA: Diagnosis present

## 2016-03-18 DIAGNOSIS — S32039A Unspecified fracture of third lumbar vertebra, initial encounter for closed fracture: Secondary | ICD-10-CM | POA: Diagnosis present

## 2016-03-18 DIAGNOSIS — Y92018 Other place in single-family (private) house as the place of occurrence of the external cause: Secondary | ICD-10-CM | POA: Diagnosis not present

## 2016-03-18 DIAGNOSIS — M62838 Other muscle spasm: Secondary | ICD-10-CM | POA: Diagnosis present

## 2016-03-18 DIAGNOSIS — W11XXXA Fall on and from ladder, initial encounter: Secondary | ICD-10-CM

## 2016-03-18 DIAGNOSIS — S32030K Wedge compression fracture of third lumbar vertebra, subsequent encounter for fracture with nonunion: Secondary | ICD-10-CM | POA: Diagnosis not present

## 2016-03-18 DIAGNOSIS — Z7983 Long term (current) use of bisphosphonates: Secondary | ICD-10-CM | POA: Diagnosis not present

## 2016-03-18 DIAGNOSIS — F329 Major depressive disorder, single episode, unspecified: Secondary | ICD-10-CM | POA: Diagnosis present

## 2016-03-18 DIAGNOSIS — S32031A Stable burst fracture of third lumbar vertebra, initial encounter for closed fracture: Secondary | ICD-10-CM | POA: Diagnosis present

## 2016-03-18 DIAGNOSIS — E876 Hypokalemia: Secondary | ICD-10-CM | POA: Diagnosis present

## 2016-03-18 DIAGNOSIS — E785 Hyperlipidemia, unspecified: Secondary | ICD-10-CM | POA: Diagnosis present

## 2016-03-18 DIAGNOSIS — D62 Acute posthemorrhagic anemia: Secondary | ICD-10-CM | POA: Diagnosis not present

## 2016-03-18 DIAGNOSIS — M5416 Radiculopathy, lumbar region: Secondary | ICD-10-CM | POA: Diagnosis present

## 2016-03-18 DIAGNOSIS — D649 Anemia, unspecified: Secondary | ICD-10-CM | POA: Diagnosis present

## 2016-03-18 HISTORY — PX: POSTERIOR LUMBAR FUSION: SHX6036

## 2016-03-18 HISTORY — PX: POSTERIOR LUMBAR FUSION 4 LEVEL: SHX6037

## 2016-03-18 LAB — BASIC METABOLIC PANEL
Anion gap: 7 (ref 5–15)
BUN: 14 mg/dL (ref 6–20)
CHLORIDE: 103 mmol/L (ref 101–111)
CO2: 24 mmol/L (ref 22–32)
CREATININE: 0.6 mg/dL (ref 0.44–1.00)
Calcium: 8.5 mg/dL — ABNORMAL LOW (ref 8.9–10.3)
GFR calc Af Amer: >60 mL/min (ref >60)
GLUCOSE: 130 mg/dL — AB (ref 65–99)
Potassium: 3.8 mmol/L (ref 3.5–5.1)
SODIUM: 134 mmol/L — AB (ref 135–145)

## 2016-03-18 LAB — CBC
HCT: 39 % (ref 36.0–46.0)
Hemoglobin: 12.3 g/dL (ref 12.0–15.0)
MCH: 29.9 pg (ref 26.0–34.0)
MCHC: 31.5 g/dL (ref 30.0–36.0)
MCV: 94.7 fL (ref 78.0–100.0)
PLATELETS: 253 10*3/uL (ref 150–400)
RBC: 4.12 MIL/uL (ref 3.87–5.11)
RDW: 13.4 % (ref 11.5–15.5)
WBC: 9.1 10*3/uL (ref 4.0–10.5)

## 2016-03-18 LAB — SURGICAL PCR SCREEN
MRSA, PCR: NEGATIVE
STAPHYLOCOCCUS AUREUS: NEGATIVE

## 2016-03-18 SURGERY — POSTERIOR LUMBAR FUSION 4 LEVEL
Anesthesia: General | Site: Back

## 2016-03-18 MED ORDER — FAMOTIDINE IN NACL 20-0.9 MG/50ML-% IV SOLN
20.0000 mg | Freq: Two times a day (BID) | INTRAVENOUS | Status: DC
  Administered 2016-03-18 – 2016-03-19 (×2): 20 mg via INTRAVENOUS
  Filled 2016-03-18 (×5): qty 50

## 2016-03-18 MED ORDER — MIDAZOLAM HCL 2 MG/2ML IJ SOLN
INTRAMUSCULAR | Status: AC
  Filled 2016-03-18: qty 2

## 2016-03-18 MED ORDER — 0.9 % SODIUM CHLORIDE (POUR BTL) OPTIME
TOPICAL | Status: DC | PRN
  Administered 2016-03-18: 1000 mL

## 2016-03-18 MED ORDER — OXYCODONE-ACETAMINOPHEN 5-325 MG PO TABS
1.0000 | ORAL_TABLET | ORAL | Status: DC | PRN
  Administered 2016-03-20 (×2): 2 via ORAL
  Administered 2016-03-22: 1 via ORAL
  Filled 2016-03-18 (×3): qty 2

## 2016-03-18 MED ORDER — HYDROMORPHONE HCL 1 MG/ML IJ SOLN
0.5000 mg | INTRAMUSCULAR | Status: DC | PRN
  Administered 2016-03-18: 1 mg via INTRAVENOUS
  Filled 2016-03-18: qty 1

## 2016-03-18 MED ORDER — CEFAZOLIN SODIUM-DEXTROSE 2-4 GM/100ML-% IV SOLN
2.0000 g | Freq: Three times a day (TID) | INTRAVENOUS | Status: AC
  Administered 2016-03-18 – 2016-03-19 (×2): 2 g via INTRAVENOUS
  Filled 2016-03-18 (×2): qty 100

## 2016-03-18 MED ORDER — METHOCARBAMOL 500 MG PO TABS: 500.0000 mg | ORAL_TABLET | Freq: Four times a day (QID) | ORAL | Status: DC | PRN

## 2016-03-18 MED ORDER — SENNOSIDES-DOCUSATE SODIUM 8.6-50 MG PO TABS: 1.0000 | ORAL_TABLET | Freq: Every evening | ORAL | Status: DC | PRN

## 2016-03-18 MED ORDER — LIDOCAINE HCL (CARDIAC) 20 MG/ML IV SOLN
INTRAVENOUS | Status: DC | PRN
  Administered 2016-03-18: 100 mg via INTRAVENOUS

## 2016-03-18 MED ORDER — PHENOL 1.4 % MT LIQD: 1.0000 | OROMUCOSAL | Status: DC | PRN

## 2016-03-18 MED ORDER — ACETAMINOPHEN 325 MG PO TABS: 650.0000 mg | ORAL_TABLET | ORAL | Status: DC | PRN

## 2016-03-18 MED ORDER — SODIUM CHLORIDE 0.9 % IV SOLN: 250.0000 mL | INTRAVENOUS | Status: DC

## 2016-03-18 MED ORDER — ONDANSETRON HCL 4 MG/2ML IJ SOLN: 4.0000 mg | INTRAMUSCULAR | Status: DC | PRN

## 2016-03-18 MED ORDER — FENTANYL CITRATE (PF) 100 MCG/2ML IJ SOLN
INTRAMUSCULAR | Status: AC
  Filled 2016-03-18: qty 4

## 2016-03-18 MED ORDER — ACETAMINOPHEN 650 MG RE SUPP: 650.0000 mg | RECTAL | Status: DC | PRN

## 2016-03-18 MED ORDER — LIDOCAINE-EPINEPHRINE 1 %-1:100000 IJ SOLN
INTRAMUSCULAR | Status: DC | PRN
  Administered 2016-03-18: 10 mL via INTRADERMAL

## 2016-03-18 MED ORDER — LACTATED RINGERS IV SOLN
INTRAVENOUS | Status: DC | PRN
  Administered 2016-03-18 (×2): via INTRAVENOUS

## 2016-03-18 MED ORDER — MIDAZOLAM HCL 5 MG/5ML IJ SOLN
INTRAMUSCULAR | Status: DC | PRN
  Administered 2016-03-18: 2 mg via INTRAVENOUS

## 2016-03-18 MED ORDER — DEXTROSE 5 % IV SOLN
INTRAVENOUS | Status: DC | PRN
  Administered 2016-03-18: 25 ug/min via INTRAVENOUS

## 2016-03-18 MED ORDER — PROPOFOL 10 MG/ML IV BOLUS
INTRAVENOUS | Status: AC
  Filled 2016-03-18: qty 20

## 2016-03-18 MED ORDER — KCL IN DEXTROSE-NACL 20-5-0.45 MEQ/L-%-% IV SOLN
INTRAVENOUS | Status: DC
  Administered 2016-03-18: 23:00:00 via INTRAVENOUS
  Filled 2016-03-18: qty 1000

## 2016-03-18 MED ORDER — PHENYLEPHRINE HCL 10 MG/ML IJ SOLN
INTRAMUSCULAR | Status: DC | PRN
  Administered 2016-03-18: 40 ug via INTRAVENOUS
  Administered 2016-03-18 (×3): 80 ug via INTRAVENOUS

## 2016-03-18 MED ORDER — FENTANYL CITRATE (PF) 100 MCG/2ML IJ SOLN
INTRAMUSCULAR | Status: DC | PRN
  Administered 2016-03-18 (×4): 50 ug via INTRAVENOUS

## 2016-03-18 MED ORDER — OXYCODONE HCL 5 MG/5ML PO SOLN: 5.0000 mg | Freq: Once | ORAL | Status: DC | PRN

## 2016-03-18 MED ORDER — HYDROMORPHONE HCL 1 MG/ML IJ SOLN: 0.2500 mg | INTRAMUSCULAR | Status: DC | PRN

## 2016-03-18 MED ORDER — SUGAMMADEX SODIUM 200 MG/2ML IV SOLN
INTRAVENOUS | Status: DC | PRN
  Administered 2016-03-18: 120 mg via INTRAVENOUS

## 2016-03-18 MED ORDER — OXYCODONE HCL 5 MG PO TABS: 5.0000 mg | ORAL_TABLET | Freq: Once | ORAL | Status: DC | PRN

## 2016-03-18 MED ORDER — SODIUM CHLORIDE 0.9% FLUSH: 3.0000 mL | INTRAVENOUS | Status: DC | PRN

## 2016-03-18 MED ORDER — CEFAZOLIN SODIUM-DEXTROSE 2-4 GM/100ML-% IV SOLN
INTRAVENOUS | Status: AC
  Filled 2016-03-18: qty 100

## 2016-03-18 MED ORDER — BISACODYL 10 MG RE SUPP: 10.0000 mg | Freq: Every day | RECTAL | Status: DC | PRN

## 2016-03-18 MED ORDER — CEFAZOLIN SODIUM-DEXTROSE 2-3 GM-% IV SOLR
INTRAVENOUS | Status: DC | PRN
  Administered 2016-03-18: 2 g via INTRAVENOUS

## 2016-03-18 MED ORDER — FLEET ENEMA 7-19 GM/118ML RE ENEM: 1.0000 | ENEMA | Freq: Once | RECTAL | Status: DC | PRN

## 2016-03-18 MED ORDER — ALBUMIN HUMAN 5 % IV SOLN
INTRAVENOUS | Status: DC | PRN
  Administered 2016-03-18: 14:00:00 via INTRAVENOUS

## 2016-03-18 MED ORDER — MENTHOL 3 MG MT LOZG: 1.0000 | LOZENGE | OROMUCOSAL | Status: DC | PRN

## 2016-03-18 MED ORDER — PROPOFOL 10 MG/ML IV BOLUS
INTRAVENOUS | Status: DC | PRN
  Administered 2016-03-18: 120 mg via INTRAVENOUS

## 2016-03-18 MED ORDER — BUPIVACAINE LIPOSOME 1.3 % IJ SUSP
INTRAMUSCULAR | Status: DC | PRN
  Administered 2016-03-18: 20 mL

## 2016-03-18 MED ORDER — ALUM & MAG HYDROXIDE-SIMETH 200-200-20 MG/5ML PO SUSP: 30.0000 mL | Freq: Four times a day (QID) | ORAL | Status: DC | PRN

## 2016-03-18 MED ORDER — SODIUM CHLORIDE 0.9% FLUSH
3.0000 mL | Freq: Two times a day (BID) | INTRAVENOUS | Status: DC
  Administered 2016-03-19 – 2016-03-21 (×5): 3 mL via INTRAVENOUS

## 2016-03-18 MED ORDER — SUGAMMADEX SODIUM 200 MG/2ML IV SOLN
INTRAVENOUS | Status: AC
  Filled 2016-03-18: qty 2

## 2016-03-18 MED ORDER — BUPIVACAINE HCL (PF) 0.5 % IJ SOLN
INTRAMUSCULAR | Status: DC | PRN
  Administered 2016-03-18: 10 mL

## 2016-03-18 MED ORDER — ONDANSETRON HCL 4 MG/2ML IJ SOLN
INTRAMUSCULAR | Status: AC
  Filled 2016-03-18: qty 2

## 2016-03-18 MED ORDER — ZOLPIDEM TARTRATE 5 MG PO TABS: 5.0000 mg | ORAL_TABLET | Freq: Every evening | ORAL | Status: DC | PRN

## 2016-03-18 MED ORDER — SODIUM CHLORIDE 0.9 % IV SOLN
INTRAVENOUS | Status: DC | PRN
  Administered 2016-03-18: 15:00:00 via INTRAVENOUS

## 2016-03-18 MED ORDER — BUPIVACAINE LIPOSOME 1.3 % IJ SUSP
20.0000 mL | Freq: Once | INTRAMUSCULAR | Status: DC
  Filled 2016-03-18: qty 20

## 2016-03-18 MED ORDER — METHOCARBAMOL 1000 MG/10ML IJ SOLN
500.0000 mg | Freq: Four times a day (QID) | INTRAVENOUS | Status: DC | PRN
  Filled 2016-03-18: qty 5

## 2016-03-18 MED ORDER — ONDANSETRON HCL 4 MG/2ML IJ SOLN: 4.0000 mg | Freq: Once | INTRAMUSCULAR | Status: DC | PRN

## 2016-03-18 MED ORDER — DOCUSATE SODIUM 100 MG PO CAPS
100.0000 mg | ORAL_CAPSULE | Freq: Two times a day (BID) | ORAL | Status: DC
  Administered 2016-03-18 – 2016-03-22 (×8): 100 mg via ORAL
  Filled 2016-03-18 (×9): qty 1

## 2016-03-18 MED ORDER — THROMBIN 20000 UNITS EX SOLR
CUTANEOUS | Status: DC | PRN
  Administered 2016-03-18: 20 mL via TOPICAL

## 2016-03-18 MED ORDER — HYDROCODONE-ACETAMINOPHEN 5-325 MG PO TABS
1.0000 | ORAL_TABLET | ORAL | Status: DC | PRN
  Administered 2016-03-21 – 2016-03-22 (×2): 2 via ORAL
  Filled 2016-03-18 (×2): qty 2

## 2016-03-18 MED ORDER — ROCURONIUM BROMIDE 100 MG/10ML IV SOLN
INTRAVENOUS | Status: DC | PRN
  Administered 2016-03-18: 10 mg via INTRAVENOUS
  Administered 2016-03-18: 70 mg via INTRAVENOUS

## 2016-03-18 SURGICAL SUPPLY — 85 items
ADH SKN CLS APL DERMABOND .7 (GAUZE/BANDAGES/DRESSINGS) ×2
BLADE BN FN 3.2XSTRL LF (MISCELLANEOUS) IMPLANT
BLADE BONE MILL FINE (MISCELLANEOUS) ×3
BLADE CLIPPER SURG (BLADE) IMPLANT
BLADE ULTRA TIP 2M (BLADE) ×2 IMPLANT
BONE CANC CHIPS 40CC CAN1/2 (Bone Implant) ×6 IMPLANT
BUR MATCHSTICK NEURO 3.0 LAGG (BURR) ×3 IMPLANT
BUR PRECISION FLUTE 5.0 (BURR) ×3 IMPLANT
CANISTER SUCT 3000ML PPV (MISCELLANEOUS) ×3 IMPLANT
CHIPS CANC BONE 40CC CAN1/2 (Bone Implant) ×2 IMPLANT
CONNECTOR RELINE 40-50MM 5.5 (Connector) ×2 IMPLANT
CONT SPEC 4OZ CLIKSEAL STRL BL (MISCELLANEOUS) ×3 IMPLANT
COVER BACK TABLE 60X90IN (DRAPES) ×3 IMPLANT
DECANTER SPIKE VIAL GLASS SM (MISCELLANEOUS) ×3 IMPLANT
DERMABOND ADVANCED (GAUZE/BANDAGES/DRESSINGS) ×4
DERMABOND ADVANCED .7 DNX12 (GAUZE/BANDAGES/DRESSINGS) ×1 IMPLANT
DRAPE C-ARM 42X72 X-RAY (DRAPES) ×6 IMPLANT
DRAPE C-ARMOR (DRAPES) IMPLANT
DRAPE LAPAROTOMY 100X72X124 (DRAPES) ×3 IMPLANT
DRAPE POUCH INSTRU U-SHP 10X18 (DRAPES) ×3 IMPLANT
DRAPE SURG 17X23 STRL (DRAPES) ×3 IMPLANT
DRSG OPSITE POSTOP 4X10 (GAUZE/BANDAGES/DRESSINGS) ×2 IMPLANT
DURAPREP 26ML APPLICATOR (WOUND CARE) ×3 IMPLANT
ELECT REM PT RETURN 9FT ADLT (ELECTROSURGICAL) ×3
ELECTRODE REM PT RTRN 9FT ADLT (ELECTROSURGICAL) ×1 IMPLANT
EVACUATOR 1/8 PVC DRAIN (DRAIN) IMPLANT
GAUZE SPONGE 4X4 12PLY STRL (GAUZE/BANDAGES/DRESSINGS) ×3 IMPLANT
GAUZE SPONGE 4X4 16PLY XRAY LF (GAUZE/BANDAGES/DRESSINGS) ×2 IMPLANT
GLOVE BIO SURGEON STRL SZ 6.5 (GLOVE) ×2 IMPLANT
GLOVE BIO SURGEON STRL SZ7 (GLOVE) ×8 IMPLANT
GLOVE BIO SURGEON STRL SZ8 (GLOVE) ×6 IMPLANT
GLOVE BIO SURGEONS STRL SZ 6.5 (GLOVE) ×2
GLOVE BIOGEL PI IND STRL 8 (GLOVE) ×2 IMPLANT
GLOVE BIOGEL PI IND STRL 8.5 (GLOVE) ×2 IMPLANT
GLOVE BIOGEL PI INDICATOR 8 (GLOVE) ×4
GLOVE BIOGEL PI INDICATOR 8.5 (GLOVE) ×4
GLOVE ECLIPSE 8.0 STRL XLNG CF (GLOVE) ×6 IMPLANT
GLOVE EXAM NITRILE LRG STRL (GLOVE) IMPLANT
GLOVE EXAM NITRILE XL STR (GLOVE) IMPLANT
GLOVE EXAM NITRILE XS STR PU (GLOVE) IMPLANT
GLOVE INDICATOR 7.5 STRL GRN (GLOVE) ×8 IMPLANT
GLOVE SS BIOGEL STRL SZ 7 (GLOVE) IMPLANT
GLOVE SUPERSENSE BIOGEL SZ 7 (GLOVE) ×2
GLOVE SURG SS PI 6.5 STRL IVOR (GLOVE) ×2 IMPLANT
GOWN STRL REUS W/ TWL LRG LVL3 (GOWN DISPOSABLE) IMPLANT
GOWN STRL REUS W/ TWL XL LVL3 (GOWN DISPOSABLE) ×2 IMPLANT
GOWN STRL REUS W/TWL 2XL LVL3 (GOWN DISPOSABLE) ×2 IMPLANT
GOWN STRL REUS W/TWL LRG LVL3 (GOWN DISPOSABLE) ×12
GOWN STRL REUS W/TWL XL LVL3 (GOWN DISPOSABLE) ×3
GRAFT BNE CHIP CANC 1-8 40 (Bone Implant) IMPLANT
KIT BASIN OR (CUSTOM PROCEDURE TRAY) ×3 IMPLANT
KIT INFUSE MEDIUM (Orthopedic Implant) ×2 IMPLANT
KIT POSITION SURG JACKSON T1 (MISCELLANEOUS) ×3 IMPLANT
KIT ROOM TURNOVER OR (KITS) ×3 IMPLANT
NDL HYPO 25X1 1.5 SAFETY (NEEDLE) ×1 IMPLANT
NDL SPNL 18GX3.5 QUINCKE PK (NEEDLE) IMPLANT
NEEDLE HYPO 25X1 1.5 SAFETY (NEEDLE) ×3 IMPLANT
NEEDLE SPNL 18GX3.5 QUINCKE PK (NEEDLE) IMPLANT
NS IRRIG 1000ML POUR BTL (IV SOLUTION) ×3 IMPLANT
PACK LAMINECTOMY NEURO (CUSTOM PROCEDURE TRAY) ×3 IMPLANT
PAD ARMBOARD 7.5X6 YLW CONV (MISCELLANEOUS) ×9 IMPLANT
PATTIES SURGICAL .5 X.5 (GAUZE/BANDAGES/DRESSINGS) IMPLANT
PATTIES SURGICAL .5 X1 (DISPOSABLE) IMPLANT
PATTIES SURGICAL 1X1 (DISPOSABLE) IMPLANT
ROD RELINE-O 5.5X300 STRT NS (Rod) IMPLANT
ROD RELINE-O 5.5X300MM STRT (Rod) ×3 IMPLANT
SCREW LOCK RELINE 5.5 TULIP (Screw) ×16 IMPLANT
SCREW RELINE-O POLY 5.5X45MM (Screw) ×8 IMPLANT
SCREW RELINE-O POLY 6.5X40 (Screw) ×8 IMPLANT
SPONGE LAP 4X18 X RAY DECT (DISPOSABLE) IMPLANT
SPONGE SURGIFOAM ABS GEL 100 (HEMOSTASIS) ×3 IMPLANT
STAPLER SKIN PROX WIDE 3.9 (STAPLE) IMPLANT
SUT VIC AB 0 CT1 18XCR BRD8 (SUTURE) IMPLANT
SUT VIC AB 0 CT1 8-18 (SUTURE)
SUT VIC AB 1 CT1 18XBRD ANBCTR (SUTURE) ×2 IMPLANT
SUT VIC AB 1 CT1 8-18 (SUTURE) ×6
SUT VIC AB 2-0 CT1 18 (SUTURE) ×6 IMPLANT
SUT VIC AB 3-0 SH 8-18 (SUTURE) ×6 IMPLANT
SYR 3ML LL SCALE MARK (SYRINGE) ×12 IMPLANT
SYR 5ML LL (SYRINGE) IMPLANT
TOWEL OR 17X24 6PK STRL BLUE (TOWEL DISPOSABLE) ×3 IMPLANT
TOWEL OR 17X26 10 PK STRL BLUE (TOWEL DISPOSABLE) ×3 IMPLANT
TRAP SPECIMEN MUCOUS 40CC (MISCELLANEOUS) ×5 IMPLANT
TRAY FOLEY W/METER SILVER 16FR (SET/KITS/TRAYS/PACK) ×3 IMPLANT
WATER STERILE IRR 1000ML POUR (IV SOLUTION) ×3 IMPLANT

## 2016-03-18 NOTE — Brief Op Note (Signed)
03/17/2016 - 03/18/2016  3:06 PM  PATIENT:  Crystal Trujillo  70 y.o. female  PRE-OPERATIVE DIAGNOSIS:  L3 burst fracture with retropulsed bone, stenosis, radiculopathy  POST-OPERATIVE DIAGNOSIS: L3 burst fracture with retropulsed bone, stenosis, radiculopathy   PROCEDURE:  Procedure(s): POSTERIOR LUMBAR FUSION Lumbar one - Lumbar five WITH DECOMPRESSION Lumbar three (N/A) with pedicle screw fixation, posterolateral arthrodesis  SURGEON:  Surgeon(s) and Role:    * Maeola HarmanJoseph Oryan Winterton, MD - Primary    * Loura HaltBenjamin Jared Ditty, MD - Assisting  PHYSICIAN ASSISTANT:   ASSISTANTS: none   ANESTHESIA:   general  EBL:  Total I/O In: 2300 [I.V.:1950; Blood:100; IV Piggyback:250] Out: 750 [Urine:450; Blood:300]  BLOOD ADMINISTERED:100 CC PRBC  DRAINS: (Medium) Hemovact drain(s) in the epidural space with  Suction Open   LOCAL MEDICATIONS USED:  MARCAINE    and LIDOCAINE   SPECIMEN:  No Specimen  DISPOSITION OF SPECIMEN:  N/A  COUNTS:  YES  TOURNIQUET:  * No tourniquets in log *  DICTATION: Patient is 70 year old woman with  Who fell from a ladder yesterday and has an L 3 burst fracture with retropulsed bone in the spinal canal and severe leg pain. It was elected to take her to surgery for decompression at L 3 and fusion at L 1  through  L 5  levels.    Procedure: Patient was placed in a prone position on the [place] table after smooth and uncomplicated induction of general endotracheal anesthesia. Her low back was prepped and draped in usual sterile fashion with betadine scrub and DuraPrep. Area of incision was infiltrated with local lidocaine. Incision was made to the lumbodorsal fascia was incised and exposure was performed of the L1 through L 5 spinous processes laminae facet joint and transverse processes. Intraoperative x-ray was obtained which confirmed correct orientation. Pedicle screws were placed at L 1 (5.5 x 45) , L 2 (5.5 x 45) , L 4 (6.5 x 40) , L 5 (6.5 x 40) levels bilaterally using AP  and Lateral fluoroscopy.   A total laminectomy of L3 was performed with disarticulation of the facet joints at this level and thorough decompression was performed of both L3, L4  nerve roots along with the common dural tube. I tamped retropulsed bone fragments away from the thecal sac and nerve roots.  There was no evidence of violation of the dura. 150 mm rods were cut and affixed to the screw heads and locked down in situ.  A cross connector was affixed to the rods overlying the fractured level.   The posterolateral region was extensively decorticated and the posterolateral region was packed with the medium BMP and bone autograft with 80 cc cancellous bone chips. Incision was infiltrated 20 cc of long-acting Marcaine was used in the posterolateral soft tissue. A medium Hemovac drain was placed and anchored with a separate stitch.  Fascia was closed with 1 Vicryl sutures skin edges were reapproximated 2 and 3-0 Vicryl sutures. The wound is dressed with Dermabond and an occlusive dressing.   the patient was extubated in the operating room and taken to recovery in stable satisfactory condition he tolerated traction well counts were correct at the end of the case.   PLAN OF CARE: Admit to inpatient   PATIENT DISPOSITION:  PACU - hemodynamically stable.   Delay start of Pharmacological VTE agent (>24hrs) due to surgical blood loss or risk of bleeding: yes

## 2016-03-18 NOTE — Transfer of Care (Signed)
Immediate Anesthesia Transfer of Care Note  Patient: Crystal Trujillo  Procedure(s) Performed: Procedure(s): POSTERIOR LUMBAR FUSION Lumbar one - Lumbar five WITH DECOMPRESSION Lumbar three (N/A)  Patient Location: PACU  Anesthesia Type:General  Level of Consciousness: sedated  Airway & Oxygen Therapy: Patient Spontanous Breathing and Patient connected to nasal cannula oxygen  Post-op Assessment: Report given to RN and Post -op Vital signs reviewed and stable  Post vital signs: Reviewed and stable  Last Vitals:  Vitals:   03/18/16 0625 03/18/16 1202  BP: (!) 115/57 (!) 124/52  Pulse: 67 68  Resp:  16  Temp: 37 C 36.7 C    Last Pain:  Vitals:   03/18/16 1031  TempSrc:   PainSc: 4       Patients Stated Pain Goal: 2 (03/17/16 2334)  Complications: No apparent anesthesia complications

## 2016-03-18 NOTE — Op Note (Signed)
03/17/2016 - 03/18/2016  3:06 PM  PATIENT:  Crystal Trujillo  70 y.o. female  PRE-OPERATIVE DIAGNOSIS:  L3 burst fracture with retropulsed bone, stenosis, radiculopathy  POST-OPERATIVE DIAGNOSIS: L3 burst fracture with retropulsed bone, stenosis, radiculopathy   PROCEDURE:  Procedure(s): POSTERIOR LUMBAR FUSION Lumbar one - Lumbar five WITH DECOMPRESSION Lumbar three (N/A) with pedicle screw fixation, posterolateral arthrodesis  SURGEON:  Surgeon(s) and Role:    * Alianys Chacko, MD - Primary    * Benjamin Jared Ditty, MD - Assisting  PHYSICIAN ASSISTANT:   ASSISTANTS: none   ANESTHESIA:   general  EBL:  Total I/O In: 2300 [I.V.:1950; Blood:100; IV Piggyback:250] Out: 750 [Urine:450; Blood:300]  BLOOD ADMINISTERED:100 CC PRBC  DRAINS: (Medium) Hemovact drain(s) in the epidural space with  Suction Open   LOCAL MEDICATIONS USED:  MARCAINE    and LIDOCAINE   SPECIMEN:  No Specimen  DISPOSITION OF SPECIMEN:  N/A  COUNTS:  YES  TOURNIQUET:  * No tourniquets in log *  DICTATION: Patient is 70-year-old woman with  Who fell from a ladder yesterday and has an L 3 burst fracture with retropulsed bone in the spinal canal and severe leg pain. It was elected to take her to surgery for decompression at L 3 and fusion at L 1  through  L 5  levels.    Procedure: Patient was placed in a prone position on the Jackson table after smooth and uncomplicated induction of general endotracheal anesthesia. Her low back was prepped and draped in usual sterile fashion with betadine scrub and DuraPrep. Area of incision was infiltrated with local lidocaine. Incision was made to the lumbodorsal fascia was incised and exposure was performed of the L1 through L 5 spinous processes laminae facet joint and transverse processes. Intraoperative x-ray was obtained which confirmed correct orientation. Pedicle screws were placed at L 1 (5.5 x 45) , L 2 (5.5 x 45) , L 4 (6.5 x 40) , L 5 (6.5 x 40) levels bilaterally using AP  and Lateral fluoroscopy.   A total laminectomy of L3 was performed with disarticulation of the facet joints at this level and thorough decompression was performed of both L3, L4  nerve roots along with the common dural tube. I tamped retropulsed bone fragments away from the thecal sac and nerve roots.  There was no evidence of violation of the dura. 150 mm rods were cut and affixed to the screw heads and locked down in situ.  A cross connector was affixed to the rods overlying the fractured level.   The posterolateral region was extensively decorticated and the posterolateral region was packed with the medium BMP and bone autograft with 80 cc cancellous bone chips. Incision was infiltrated 20 cc of long-acting Marcaine was used in the posterolateral soft tissue. A medium Hemovac drain was placed and anchored with a separate stitch.  Fascia was closed with 1 Vicryl sutures skin edges were reapproximated 2 and 3-0 Vicryl sutures. The wound is dressed with Dermabond and an occlusive dressing.   the patient was extubated in the operating room and taken to recovery in stable satisfactory condition he tolerated traction well counts were correct at the end of the case.   PLAN OF CARE: Admit to inpatient   PATIENT DISPOSITION:  PACU - hemodynamically stable.   Delay start of Pharmacological VTE agent (>24hrs) due to surgical blood loss or risk of bleeding: yes  

## 2016-03-18 NOTE — Progress Notes (Signed)
Patient awake, alert, conversant.  MAEW with good power.  Doing well.

## 2016-03-18 NOTE — Anesthesia Postprocedure Evaluation (Signed)
Anesthesia Post Note  Patient: Crystal Trujillo  Procedure(s) Performed: Procedure(s) (LRB): POSTERIOR LUMBAR FUSION Lumbar one - Lumbar five WITH DECOMPRESSION Lumbar three (N/A)  Patient location during evaluation: PACU Anesthesia Type: General Level of consciousness: awake and alert Pain management: pain level controlled Vital Signs Assessment: post-procedure vital signs reviewed and stable Respiratory status: spontaneous breathing, nonlabored ventilation, respiratory function stable and patient connected to nasal cannula oxygen Cardiovascular status: blood pressure returned to baseline and stable Postop Assessment: no signs of nausea or vomiting Anesthetic complications: no    Last Vitals:  Vitals:   03/18/16 1600 03/18/16 1632  BP: 114/69 112/86  Pulse: 80   Resp: 17 16  Temp: 36.4 C     Last Pain:  Vitals:   03/18/16 1600  TempSrc:   PainSc: 3                  Reino KentJudd, [name]

## 2016-03-18 NOTE — Progress Notes (Signed)
Subjective: Patient reports pain into back and legs still severe with spasms  Objective: Vital signs in last 24 hours: Temp:  [97.7 F (36.5 C)-98.6 F (37 C)] 98.6 F (37 C) (09/03 0625) Pulse Rate:  [67-83] 67 (09/03 0625) Resp:  [15-16] 16 (09/02 2037) BP: (110-134)/(52-82) 115/57 (09/03 0625) SpO2:  [93 %-100 %] 99 % (09/03 0625) Weight:  [60.8 kg (134 lb)] 60.8 kg (134 lb) (09/02 1209)  Intake/Output from previous day: 09/02 0701 - 09/03 0700 In: -  Out: 500 [Urine:500] Intake/Output this shift: No intake/output data recorded.  Physical Exam: Strength in both legs full.  Patient is still having pain into her thighs, right greater than left  Lab Results:  Recent Labs  03/17/16 2015 03/18/16 0306  WBC 9.9 9.1  HGB 13.1 12.3  HCT 39.8 39.0  PLT 254 253   BMET  Recent Labs  03/17/16 2015 03/18/16 0306  NA 135 134*  K 4.1 3.8  CL 106 103  CO2 24 24  GLUCOSE 120* 130*  BUN 14 14  CREATININE 0.65 0.60  CALCIUM 8.8* 8.5*    Studies/Results: Dg Lumbar Spine Complete  Result Date: 03/17/2016 CLINICAL DATA:  Pt states that she fell off of a 5 foot ladder this morning while trying to change the battery in the smoke detector. When she fell backwards she hit her back on the wall and fell to floor landing on her side. Pain in lower back and pelvis. Pain comes and goes and is excruciating. Limited mobility. EXAM: LUMBAR SPINE - COMPLETE 4+ VIEW COMPARISON:  None. FINDINGS: Acute fracture of L3. There is proximally 40% loss of anterior height. No definite retropulsion of fracture fragments. However, detail is limited given the portable nature of the exam. The fractures are identified. No spondylolisthesis. IMPRESSION: Acute fracture of L3. Electronically Signed   By: Norva Pavlov M.D.   On: 03/17/2016 13:32   Ct Lumbar Spine Wo Contrast  Result Date: 03/17/2016 CLINICAL DATA:  Fall 15 feet from a ladder. Low back pain. L3 fracture on radiographs. EXAM: CT LUMBAR SPINE  WITHOUT CONTRAST TECHNIQUE: Multidetector CT imaging of the lumbar spine was performed without intravenous contrast administration. Multiplanar CT image reconstructions were also generated. COMPARISON:  Lumbar spine radiographs earlier today FINDINGS: Lumbar segmentation is normal. As seen on earlier radiographs, there is an L3 vertebral body burst fracture with approximately 60% height loss centrally. Posterior vertebral body fragments are retropulsed approximately 6 mm into the canal with resultant mild to moderate spinal stenosis. No posterior element fracture is identified. Vertebral body heights are preserved elsewhere in the lumbar spine without additional fractures identified. Intervertebral disc space heights are preserved. There is no significant listhesis. Multilevel facet arthrosis is noted, moderate at L5-S1. Abdominal aortic atherosclerosis is noted. IMPRESSION: 1. L3 burst fracture with 60% height loss, 6 mm retropulsion, and mild-to-moderate spinal stenosis. 2. Aortic atherosclerosis. Electronically Signed   By: Sebastian Ache M.D.   On: 03/17/2016 16:10   Dg Hips Bilat W Or Wo Pelvis 3-4 Views  Result Date: 03/17/2016 CLINICAL DATA:  Pt states that she fell off of a 5 foot ladder this morning while trying to change the battery in the smoke detector. When she fell backwards she hit her back on the wall and fell to floor landing on her side. Pain in the lower back and pelvis comes and goes and is excruciating. EXAM: DG HIP (WITH OR WITHOUT PELVIS) 3-4V BILAT COMPARISON:  Lumbar spine same day FINDINGS: There is no evidence  of hip fracture or dislocation. There is no evidence of arthropathy or other focal bone abnormality. IMPRESSION: Negative. Electronically Signed   By: Norva PavlovElizabeth  Brown M.D.   On: 03/17/2016 13:34    Assessment/Plan: In light of persistent pain, severity of burst fracture, lower extremity symptoms, will proceed with surgery today.    LOS: 0 days    Dorian HeckleSTERN,Jorge Amparo D,  MD 03/18/2016, 8:29 AM

## 2016-03-18 NOTE — Anesthesia Preprocedure Evaluation (Addendum)
Anesthesia Evaluation  Patient identified by MRN, date of birth, ID band Patient awake    Reviewed: Allergy & Precautions, H&P , NPO status , Patient's Chart, lab work & pertinent test results  History of Anesthesia Complications Negative for: history of anesthetic complications  Airway Mallampati: II  TM Distance: >3 FB Neck ROM: full    Dental no notable dental hx.    Pulmonary neg pulmonary ROS,    Pulmonary exam normal breath sounds clear to auscultation       Cardiovascular negative cardio ROS Normal cardiovascular exam Rhythm:regular Rate:Normal     Neuro/Psych negative neurological ROS     GI/Hepatic negative GI ROS, Neg liver ROS,   Endo/Other  Hypothyroidism   Renal/GU negative Renal ROS     Musculoskeletal  (+) Arthritis ,   Abdominal   Peds  Hematology negative hematology ROS (+)   Anesthesia Other Findings   Reproductive/Obstetrics negative OB ROS                             Anesthesia Physical Anesthesia Plan  ASA: II and emergent  Anesthesia Plan: General   Post-op Pain Management:    Induction: Intravenous  Airway Management Planned: Oral ETT  Additional Equipment:   Intra-op Plan:   Post-operative Plan: Extubation in OR  Informed Consent: I have reviewed the patients History and Physical, chart, labs and discussed the procedure including the risks, benefits and alternatives for the proposed anesthesia with the patient or authorized representative who has indicated his/her understanding and acceptance.   Dental Advisory Given  Plan Discussed with: Anesthesiologist, CRNA and Surgeon  Anesthesia Plan Comments: (L3 fracture due to fall from ladder)      Anesthesia Quick Evaluation

## 2016-03-18 NOTE — Progress Notes (Signed)
PROGRESS NOTE    Crystal Trujillo  ZOX:[id number] DOB: Jul 13, 1946 DOA: 03/17/2016 PCP: Gaspar Garbe, MD     Brief Narrative:  70 y.o. WF PMHx Osteoporosis, Hypothyroidism, and HLD;   who presents after having a fall at home. Patient reports that she was standing on a ladder to changing out smoke detector batteries this afternoon in the hallway of her home when she lost her footing and fell. She states that her body "ricocheted" against the wall and slid down onto her bottom. She denies any significant loss of consciousness. Within 10 seconds she knew this was not ordered on and states admits having intense pain for which she broke out in a sweat. She states that it took approximately 2 hours for her to be able to get help. Neighbors nearby heard her yelling and came in and called 911. At this time she reports pains shooting down her right leg with accompanying muscle spasms. She reports that her back doesn't hurt so much but the surrounding areas from the fall.  ED Course: Upon admission into the emergency room the patient was seen to be afebrile with vital signs within normal limits. Imaging studies revealed a L3 burst fracture with 60% vertebral height loss. Dr. Venetia Maxon of Neurosurgery was consulted and evaluated the patient in the emergency room. She was fitted for a TLSO brace. [facility] called to admit for pain control and physical therapy.   Subjective: 9/3 patient off floor for surgery. No charge   Assessment & Plan:   Principal Problem:   Closed compression fracture of L3 lumbar vertebra [facility]) Active Problems:   Fall from ladder   Osteoporosis   Hypothyroidism   Hyperlipidemia   Fall with L3 fracture:  -Acute. Patient fell from ladder -9/3 off floor for surgery; see note below - TLSO brace per surgery - Physical therapy to eval and treat.  Osteoporosis:  - Continue Fosamax  Hypothyroidism - Synthroid 75 mg daily  Depression:  - Celexa 20 mg daily  Hyperlipidemia -  Lipitor 40 mg daily   DVT prophylaxis: Lovenox Code Status: Full Family Communication: None Disposition Plan: Per surgery   Consultants:  Dr.Joseph Venetia Maxon Neurosurgery    Procedures/Significant Events:  9/3 POSTERIOR LUMBAR FUSION Lumbar one - Lumbar five WITH DECOMPRESSION Lumbar three (N/A) with pedicle screw fixation, posterolateral arthrodesis  Cultures None  Antimicrobials: None   Devices None   LINES / TUBES:  None    Continuous Infusions: . sodium chloride 75 mL/hr at 03/17/16 2339     Objective: Vitals:   03/17/16 2000 03/17/16 2037 03/17/16 2120 03/18/16 0625  BP: 121/64 116/66 134/61 (!) 115/57  Pulse: 72 73 72 67  Resp: 16 16    Temp:   97.7 F (36.5 C) 98.6 F (37 C)  TempSrc:   Oral Oral  SpO2: 99% 98% 98% 99%  Weight:      Height:        Intake/Output Summary (Last 24 hours) at 03/18/16 0854 Last data filed at 03/17/16 2330  Gross per 24 hour  Intake                0 ml  Output              500 ml  Net             -500 ml   Filed Weights   03/17/16 1209  Weight: 60.8 kg (134 lb)    Examination:  Patient off floor for surgery no charge .  Data Reviewed: Care during the described time interval was provided by me .  I have reviewed this patient's available data, including medical history, events of note, physical examination, and all test results as part of my evaluation. I have personally reviewed and interpreted all radiology studies.  CBC:  Recent Labs Lab 03/17/16 2015 03/18/16 0306  WBC 9.9 9.1  NEUTROABS 8.2*  --   HGB 13.1 12.3  HCT 39.8 39.0  MCV 92.8 94.7  PLT 254 253   Basic Metabolic Panel:  Recent Labs Lab 03/17/16 2015 03/18/16 0306  NA 135 134*  K 4.1 3.8  CL 106 103  CO2 24 24  GLUCOSE 120* 130*  BUN 14 14  CREATININE 0.65 0.60  CALCIUM 8.8* 8.5*   GFR: Estimated Creatinine Clearance: 56.3 mL/min (by C-G formula based on SCr of 0.8 mg/dL). Liver Function Tests: No results for input(s):  AST, ALT, ALKPHOS, BILITOT, PROT, ALBUMIN in the last 168 hours. No results for input(s): LIPASE, AMYLASE in the last 168 hours. No results for input(s): AMMONIA in the last 168 hours. Coagulation Profile: No results for input(s): INR, PROTIME in the last 168 hours. Cardiac Enzymes: No results for input(s): CKTOTAL, CKMB, CKMBINDEX, TROPONINI in the last 168 hours. BNP (last 3 results) No results for input(s): PROBNP in the last 8760 hours. HbA1C: No results for input(s): HGBA1C in the last 72 hours. CBG: No results for input(s): GLUCAP in the last 168 hours. Lipid Profile: No results for input(s): CHOL, HDL, LDLCALC, TRIG, CHOLHDL, LDLDIRECT in the last 72 hours. Thyroid Function Tests: No results for input(s): TSH, T4TOTAL, FREET4, T3FREE, THYROIDAB in the last 72 hours. Anemia Panel: No results for input(s): VITAMINB12, FOLATE, FERRITIN, TIBC, IRON, RETICCTPCT in the last 72 hours. Sepsis Labs: No results for input(s): PROCALCITON, LATICACIDVEN in the last 168 hours.  No results found for this or any previous visit (from the past 240 hour(s)).       Radiology Studies: Dg Lumbar Spine Complete  Result Date: 03/17/2016 CLINICAL DATA:  Pt states that she fell off of a 5 foot ladder this morning while trying to change the battery in the smoke detector. When she fell backwards she hit her back on the wall and fell to floor landing on her side. Pain in lower back and pelvis. Pain comes and goes and is excruciating. Limited mobility. EXAM: LUMBAR SPINE - COMPLETE 4+ VIEW COMPARISON:  None. FINDINGS: Acute fracture of L3. There is proximally 40% loss of anterior height. No definite retropulsion of fracture fragments. However, detail is limited given the portable nature of the exam. The fractures are identified. No spondylolisthesis. IMPRESSION: Acute fracture of L3. Electronically Signed   By: Norva Pavlov M.D.   On: 03/17/2016 13:32   Ct Lumbar Spine Wo Contrast  Result Date:  03/17/2016 CLINICAL DATA:  Fall 15 feet from a ladder. Low back pain. L3 fracture on radiographs. EXAM: CT LUMBAR SPINE WITHOUT CONTRAST TECHNIQUE: Multidetector CT imaging of the lumbar spine was performed without intravenous contrast administration. Multiplanar CT image reconstructions were also generated. COMPARISON:  Lumbar spine radiographs earlier today FINDINGS: Lumbar segmentation is normal. As seen on earlier radiographs, there is an L3 vertebral body burst fracture with approximately 60% height loss centrally. Posterior vertebral body fragments are retropulsed approximately 6 mm into the canal with resultant mild to moderate spinal stenosis. No posterior element fracture is identified. Vertebral body heights are preserved elsewhere in the lumbar spine without additional fractures identified. Intervertebral disc space heights are preserved.  There is no significant listhesis. Multilevel facet arthrosis is noted, moderate at L5-S1. Abdominal aortic atherosclerosis is noted. IMPRESSION: 1. L3 burst fracture with 60% height loss, 6 mm retropulsion, and mild-to-moderate spinal stenosis. 2. Aortic atherosclerosis. Electronically Signed   By: Sebastian AcheAllen  Grady M.D.   On: 03/17/2016 16:10   Dg Hips Bilat W Or Wo Pelvis 3-4 Views  Result Date: 03/17/2016 CLINICAL DATA:  Pt states that she fell off of a 5 foot ladder this morning while trying to change the battery in the smoke detector. When she fell backwards she hit her back on the wall and fell to floor landing on her side. Pain in the lower back and pelvis comes and goes and is excruciating. EXAM: DG HIP (WITH OR WITHOUT PELVIS) 3-4V BILAT COMPARISON:  Lumbar spine same day FINDINGS: There is no evidence of hip fracture or dislocation. There is no evidence of arthropathy or other focal bone abnormality. IMPRESSION: Negative. Electronically Signed   By: Norva PavlovElizabeth  Brown M.D.   On: 03/17/2016 13:34        Scheduled Meds: . atorvastatin  40 mg Oral q1800  .  enoxaparin (LOVENOX) injection  40 mg Subcutaneous Q24H  . levothyroxine  75 mcg Oral QPM  . pantoprazole  40 mg Oral Daily  . traZODone  50 mg Oral QHS   Continuous Infusions: . sodium chloride 75 mL/hr at 03/17/16 2339     LOS: 0 days    Time spent:0 min    August Gosser, Roselind MessierURTIS J, MD [facility] Pager [phone]  If 7PM-7AM, please contact night-coverage [email or web address] Password [email or web address] 03/18/2016, 8:54 AM

## 2016-03-18 NOTE — Anesthesia Procedure Notes (Signed)
Procedure Name: Intubation Date/Time: 03/18/2016 12:32 PM Performed by: Lucinda DellECARLO, Arjen Deringer M Pre-anesthesia Checklist: Patient identified, Emergency Drugs available, Suction available and Patient being monitored Patient Re-evaluated:Patient Re-evaluated prior to inductionOxygen Delivery Method: Circle system utilized Preoxygenation: Pre-oxygenation with 100% oxygen Intubation Type: IV induction Ventilation: Mask ventilation without difficulty Laryngoscope Size: Mac and 3 Grade View: Grade I Tube type: Oral Tube size: 7.0 mm Number of attempts: 1 Airway Equipment and Method: Stylet Placement Confirmation: ETT inserted through vocal cords under direct vision,  positive ETCO2 and breath sounds checked- equal and bilateral Secured at: 20 cm Tube secured with: Tape Dental Injury: Teeth and Oropharynx as per pre-operative assessment

## 2016-03-19 ENCOUNTER — Encounter (HOSPITAL_COMMUNITY): Payer: Self-pay | Admitting: General Practice

## 2016-03-19 MED ORDER — CITALOPRAM HYDROBROMIDE 20 MG PO TABS
20.0000 mg | ORAL_TABLET | Freq: Every day | ORAL | Status: DC
  Administered 2016-03-19 – 2016-03-22 (×4): 20 mg via ORAL
  Filled 2016-03-19 (×3): qty 1

## 2016-03-19 MED ORDER — CARISOPRODOL 350 MG PO TABS
350.0000 mg | ORAL_TABLET | Freq: Three times a day (TID) | ORAL | Status: DC
  Administered 2016-03-19 – 2016-03-22 (×6): 350 mg via ORAL
  Filled 2016-03-19 (×6): qty 1

## 2016-03-19 MED ORDER — SENNA 8.6 MG PO TABS
2.0000 | ORAL_TABLET | Freq: Every day | ORAL | Status: DC
  Administered 2016-03-19 – 2016-03-22 (×4): 17.2 mg via ORAL
  Filled 2016-03-19 (×3): qty 2

## 2016-03-19 NOTE — Clinical Social Work Note (Signed)
Clinical Social Work Assessment  Patient Details  Name: Crystal Trujillo MRN: [id number] Date of Birth: 03/14/1946  Date of referral:  03/19/16               Reason for consult:  Facility Placement                Permission sought to share information with:   (Facilities) Permission granted to share information::   (Facilities)  Name::        Agency::     Relationship::     Contact Information:     Housing/Transportation Living arrangements for the past 2 months:  Single Family Home Source of Information:  Other (Comment Required) (Nephew/ Tyler AasJeff Flater [phone]. He states pt's neice is supportive as well. Chirs/ [phone]) Patient Interpreter Needed:  None Criminal Activity/Legal Involvement Pertinent to Current Situation/Hospitalization:  No - Comment as needed Significant Relationships:  Other Family Members (Nephew and Neice) Lives with:  Self Do you feel safe going back to the place where you live?   (Patient is interested in facility) Need for family participation in patient care:  Yes (Comment)  Care giving concerns:  Nephew states pt lives at home alone. He states pt was completing ADL's independently. However, [facility] which caused her to break her  Back. Nephew states that due to incident she now needs assistance with ADLs/   Social Worker assessment / plan:  SW will refer pt to facilities. Patient and nephew are agreeable. 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  Employment status:  Environmental education officerull-Time (Patient and nephew states she works at a facility as a Naval architectcare taker.) Health and safety inspectornsurance information:  Other (Comment Required) Production designer, theatre/television/film(BCBS Medicare) PT Recommendations:  Skilled Nursing Facility Information / Referral to community resources:   (Referred to SNF)  Patient/Family's Response to care:  Patient and nephew are appropriate.  Patient/Family's Understanding of and Emotional Response to Diagnosis,  Current Treatment, and Prognosis:  There are no questions for SW at this time.  Emotional Assessment Appearance:    Attitude/Demeanor/Rapport:  Lethargic Affect (typically observed):  Appropriate Orientation:  Oriented to Self, Oriented to Place, Oriented to  Time, Oriented to Situation (Alert but drowsy) Alcohol / Substance use:  Not Applicable Psych involvement (Current and /or in the community):  No (Comment)  Discharge Needs  Concerns to be addressed:  No discharge needs identified Readmission within the last 30 days:  No Current discharge risk:  None Barriers to Discharge:  No Barriers Identified   Crista CurbWhitaker, Tarvares Lant R 03/19/2016, 1:57 PM

## 2016-03-19 NOTE — Progress Notes (Signed)
PROGRESS NOTE  Crystal Trujillo ZOX:[id number] DOB: 04/01/1946 DOA: 03/17/2016 PCP: Gaspar Garbe, MD  Brief History:  70 year old female with a history of osteoporosis, hypothyroidism, hyperlipidemia presented after a mechanical fall at home. The patient was on a ladder changing her smoke detector batteries when she lost her footing and fell onto the ground. There was no loss of consciousness. Because of intense pain, the patient was unable to move and laid on the floor for approximately 2 hours until her neighbors heard her yelling for help. Upon presentation to the emergency department CT of the lumbar spine revealed L3 burst fracture with a 60% height loss. Neurosurgery was consulted, and Dr. Venetia Maxon evaluated the patient and placed the patient in TLSO brace initially. The patient was subsequently taken to surgery on 03/18/2016 where she had a lumbar decompression with pedicle screw fixation and posterolateral arthrodesis.  Assessment/Plan: Compression fracture L3 -03/18/2016--lumbar decompression with pedicle screw fixation and posterolateral arthrodesis--Dr. Venetia Maxon -appreciate neurosurgery -pain control -PT/OT -stool softener  Hypothyroidism -Continue Synthroid  Hyperlipidemia -Continue statin  Anxiety/depression -continue trazodone and citalopram    Disposition Plan:   SNF when cleared by neurosurgery Family Communication:   Family at bedside updated 03/19/16  Consultants:  Neurosurgery  Code Status:  FULL / DNR  DVT Prophylaxis:   [place] Lovenox   Procedures: As Listed in Progress Note Above  Antibiotics: None    Subjective: Patient complains of back pain. Denies any fevers, chills, chest pain, shortness breath, nausea, vomiting, diarrhea. No dysuria or hematuria. No rashes.  Objective: Vitals:   03/18/16 1922 03/19/16 0003 03/19/16 0615 03/19/16 1444  BP: (!) 99/49 (!) 104/52 118/88 (!) 114/39  Pulse: 86 90 92 91  Resp:   16 18  Temp: 98.3 F (36.8 C)  99.3 F (37.4 C) 98.6 F (37 C) 99.7 F (37.6 C)  TempSrc: Oral Oral Oral Oral  SpO2: 96% 94% 97% 98%  Weight:      Height:        Intake/Output Summary (Last 24 hours) at 03/19/16 1758 Last data filed at 03/19/16 1500  Gross per 24 hour  Intake             4455 ml  Output             1050 ml  Net             3405 ml   Weight change:  Exam:   General:  Pt is alert, follows commands appropriately, not in acute distress  HEENT: No icterus, No thrush, No neck mass, [place]/AT  Cardiovascular: RRR, S1/S2, no rubs, no gallops  Respiratory: CTA bilaterally, no wheezing, no crackles, no rhonchi  Abdomen: Soft/+BS, non tender, non distended, no guarding  Extremities: No edema, No lymphangitis, No petechiae, No rashes, no synovitis   Data Reviewed: I have personally reviewed following labs and imaging studies Basic Metabolic Panel:  Recent Labs Lab 03/17/16 2015 03/18/16 0306  NA 135 134*  K 4.1 3.8  CL 106 103  CO2 24 24  GLUCOSE 120* 130*  BUN 14 14  CREATININE 0.65 0.60  CALCIUM 8.8* 8.5*   Liver Function Tests: No results for input(s): AST, ALT, ALKPHOS, BILITOT, PROT, ALBUMIN in the last 168 hours. No results for input(s): LIPASE, AMYLASE in the last 168 hours. No results for input(s): AMMONIA in the last 168 hours. Coagulation Profile: No results for input(s): INR, PROTIME in the last 168 hours. CBC:  Recent Labs  Lab 03/17/16 2015 03/18/16 0306  WBC 9.9 9.1  NEUTROABS 8.2*  --   HGB 13.1 12.3  HCT 39.8 39.0  MCV 92.8 94.7  PLT 254 253   Cardiac Enzymes: No results for input(s): CKTOTAL, CKMB, CKMBINDEX, TROPONINI in the last 168 hours. BNP: Invalid input(s): POCBNP CBG: No results for input(s): GLUCAP in the last 168 hours. HbA1C: No results for input(s): HGBA1C in the last 72 hours. Urine analysis: No results found for: COLORURINE, APPEARANCEUR, LABSPEC, PHURINE, GLUCOSEU, HGBUR, BILIRUBINUR, KETONESUR, PROTEINUR, UROBILINOGEN, NITRITE,  LEUKOCYTESUR Sepsis Labs: @LABRCNTIP (procalcitonin:4,lacticidven:4) ) Recent Results (from the past 240 hour(s))  Surgical pcr screen     Status: None   Collection Time: 03/18/16 10:28 AM  Result Value Ref Range Status   MRSA, PCR NEGATIVE NEGATIVE Final   Staphylococcus aureus NEGATIVE NEGATIVE Final    Comment:        The Xpert SA Assay (FDA approved for NASAL specimens in patients over 11021 years of age), is one component of a comprehensive surveillance program.  Test performance has been validated by [facility] for patients greater than or equal to 69 year old. It is not intended to diagnose infection nor to guide or monitor treatment.      Scheduled Meds: . atorvastatin  40 mg Oral q1800  . bupivacaine liposome  20 mL Infiltration Once  . carisoprodol  350 mg Oral TID  . docusate sodium  100 mg Oral BID  . enoxaparin (LOVENOX) injection  40 mg Subcutaneous Q24H  . famotidine (PEPCID) IV  20 mg Intravenous Q12H  . levothyroxine  75 mcg Oral QPM  . pantoprazole  40 mg Oral Daily  . sodium chloride flush  3 mL Intravenous Q12H  . traZODone  50 mg Oral QHS   Continuous Infusions: . sodium chloride 75 mL/hr at 03/17/16 2339  . sodium chloride    . dextrose 5 % and 0.45 % NaCl with KCl 20 mEq/L 75 mL/hr at 03/18/16 2249    Procedures/Studies: Dg Lumbar Spine Complete  Result Date: 03/18/2016 CLINICAL DATA:  Posterior lumbar fusion with L1-5 decompression EXAM: DG C-ARM 61-120 MIN; LUMBAR SPINE - COMPLETE 4+ VIEW FLUOROSCOPY TIME:  52 seconds COMPARISON:  CT lumbar spine dated 03/17/2016 FINDINGS: Intraoperative fluoroscopic spot radiographs during posterior lumbar fixation. Mild superior endplate compression fracture deformity at L3. Pedicle screws at L1 and L2. IMPRESSION: Intraoperative fluoroscopic spot radiographs during posterior lumbar fixation, as above. Electronically Signed   By: Charline BillsSriyesh  Krishnan M.D.   On: 03/18/2016 14:39   Dg Lumbar Spine Complete  Result  Date: 03/17/2016 CLINICAL DATA:  Pt states that she fell off of a 5 foot ladder this morning while trying to change the battery in the smoke detector. When she fell backwards she hit her back on the wall and fell to floor landing on her side. Pain in lower back and pelvis. Pain comes and goes and is excruciating. Limited mobility. EXAM: LUMBAR SPINE - COMPLETE 4+ VIEW COMPARISON:  None. FINDINGS: Acute fracture of L3. There is proximally 40% loss of anterior height. No definite retropulsion of fracture fragments. However, detail is limited given the portable nature of the exam. The fractures are identified. No spondylolisthesis. IMPRESSION: Acute fracture of L3. Electronically Signed   By: Norva PavlovElizabeth  Brown M.D.   On: 03/17/2016 13:32   Ct Lumbar Spine Wo Contrast  Result Date: 03/17/2016 CLINICAL DATA:  Fall 15 feet from a ladder. Low back pain. L3 fracture on radiographs. EXAM: CT LUMBAR SPINE WITHOUT CONTRAST TECHNIQUE:  Multidetector CT imaging of the lumbar spine was performed without intravenous contrast administration. Multiplanar CT image reconstructions were also generated. COMPARISON:  Lumbar spine radiographs earlier today FINDINGS: Lumbar segmentation is normal. As seen on earlier radiographs, there is an L3 vertebral body burst fracture with approximately 60% height loss centrally. Posterior vertebral body fragments are retropulsed approximately 6 mm into the canal with resultant mild to moderate spinal stenosis. No posterior element fracture is identified. Vertebral body heights are preserved elsewhere in the lumbar spine without additional fractures identified. Intervertebral disc space heights are preserved. There is no significant listhesis. Multilevel facet arthrosis is noted, moderate at L5-S1. Abdominal aortic atherosclerosis is noted. IMPRESSION: 1. L3 burst fracture with 60% height loss, 6 mm retropulsion, and mild-to-moderate spinal stenosis. 2. Aortic atherosclerosis. Electronically Signed    By: Sebastian Ache M.D.   On: 03/17/2016 16:10   Dg C-arm 1-60 Min  Result Date: 03/18/2016 CLINICAL DATA:  Posterior lumbar fusion with L1-5 decompression EXAM: DG C-ARM 61-120 MIN; LUMBAR SPINE - COMPLETE 4+ VIEW FLUOROSCOPY TIME:  52 seconds COMPARISON:  CT lumbar spine dated 03/17/2016 FINDINGS: Intraoperative fluoroscopic spot radiographs during posterior lumbar fixation. Mild superior endplate compression fracture deformity at L3. Pedicle screws at L1 and L2. IMPRESSION: Intraoperative fluoroscopic spot radiographs during posterior lumbar fixation, as above. Electronically Signed   By: Charline Bills M.D.   On: 03/18/2016 14:39   Dg Hips Bilat W Or Wo Pelvis 3-4 Views  Result Date: 03/17/2016 CLINICAL DATA:  Pt states that she fell off of a 5 foot ladder this morning while trying to change the battery in the smoke detector. When she fell backwards she hit her back on the wall and fell to floor landing on her side. Pain in the lower back and pelvis comes and goes and is excruciating. EXAM: DG HIP (WITH OR WITHOUT PELVIS) 3-4V BILAT COMPARISON:  Lumbar spine same day FINDINGS: There is no evidence of hip fracture or dislocation. There is no evidence of arthropathy or other focal bone abnormality. IMPRESSION: Negative. Electronically Signed   By: Norva Pavlov M.D.   On: 03/17/2016 13:34    Jahzara Slattery, DO  [facility] Pager [phone]  If 7PM-7AM, please contact night-coverage [email or web address] Password [email or web address] 03/19/2016, 5:58 PM   LOS: 1 day

## 2016-03-19 NOTE — Evaluation (Signed)
Occupational Therapy Evaluation Patient Details Name: Crystal Trujillo MRN: [id number] DOB: 1946/03/16 Today's Date: 03/19/2016    History of Present Illness 70 y.o. female s/p L1-5 posterior fusion following a fall from a ladder. PMH: osteoporosis.   Clinical Impression   PTA pt independent in ADL and IADL sometimes doing furniture walking. Pt severely limited by pain during todays session and refused to do anything more than sit EOB. Pt with significant decrease in independence (see deficit list below) Pt will benefit from skilled OT intervention in the acute care setting to increase tolerance, strength, and self-care abilities prior to d/c to SNF.    Follow Up Recommendations  SNF;Supervision/Assistance - 24 hour    Equipment Recommendations  Other (comment) (to be determined at next venue of care)    Recommendations for Other Services       Precautions / Restrictions Precautions Precautions: Back Precaution Booklet Issued: Yes (comment) Precaution Comments: reviewed back precautions Required Braces or Orthoses: Spinal Brace (no doctors orders, TSLO in room) Spinal Brace: Thoracolumbosacral orthotic Other Brace/Splint: Has TLSO in room from ED, no order for brace use since surgery.  Restrictions Weight Bearing Restrictions: No      Mobility Bed Mobility Overal bed mobility: Needs Assistance Bed Mobility: Rolling;Sidelying to Sit;Sit to Sidelying Rolling: Max assist Sidelying to sit: Max assist     Sit to sidelying: Max assist General bed mobility comments: Pt required HEAVY encouragmanet from therapist and neice/nephew to sit EOB, Pt with increased breathing for pain management, vc to relax muscles.  Transfers                 General transfer comment: Pt refusing to attempt. OT encouraged Pt saying that the goal for tomorrow would be to use the bedside commode    Balance Overall balance assessment: Needs assistance Sitting-balance support: Bilateral upper  extremity supported Sitting balance-Leahy Scale: Poor Sitting balance - Comments: Pt did not twist, clenching muscles in legs, vc to relax, OT used bed pad for assist                                    ADL Overall ADL's : Needs assistance/impaired     Grooming: Wash/dry face;Set up;Bed level Grooming Details (indicate cue type and reason): Pt able to perform bed level ADL Upper Body Bathing: Maximal assistance;Sitting   Lower Body Bathing: Total assistance;Sitting/lateral leans Lower Body Bathing Details (indicate cue type and reason): Pt unable/unwilling to attempt to bring feet up for LB bathing     Lower Body Dressing: Total assistance;Sitting/lateral leans                 General ADL Comments: Pt's family educated in AE kit, and all equipment (long handled sponge, grabber/reacher, long shoe horn, toileting aide, sock donner)     Vision     Perception     Praxis      Pertinent Vitals/Pain Pain Assessment: 0-10 Pain Score: 8  Faces Pain Scale: Hurts whole lot Pain Location: back and right hip (sciatica)  Pain Descriptors / Indicators: Grimacing;Guarding;Moaning;Operative site guarding;Sharp;Tender Pain Intervention(s): Limited activity within patient's tolerance;Monitored during session     Hand Dominance Right   Extremity/Trunk Assessment Upper Extremity Assessment Upper Extremity Assessment: Overall WFL for tasks assessed   Lower Extremity Assessment Lower Extremity Assessment: Generalized weakness       Communication Communication Communication: No difficulties   Cognition Arousal/Alertness: Lethargic Behavior During Therapy: Anxious  Overall Cognitive Status: Within Functional Limits for tasks assessed       Memory: Decreased recall of precautions             General Comments       Exercises       Shoulder Instructions      Home Living Family/patient expects to be discharged to:: Skilled nursing facility Living  Arrangements: Alone Available Help at Discharge: Other (Comment) (unsure) Type of Home: House Home Access: Level entry     Home Layout: One level                   Additional Comments: Pt and family in agreement that SNF is best and safest option      Prior Functioning/Environment Level of Independence: Independent with assistive device(s)        Comments: Would do furniture walking    OT Diagnosis: Acute pain   OT Problem List: Decreased strength;Decreased range of motion;Decreased activity tolerance;Impaired balance (sitting and/or standing);Decreased safety awareness;Decreased knowledge of use of DME or AE;Decreased knowledge of precautions;Pain   OT Treatment/Interventions: Self-care/ADL training;DME and/or AE instruction;Therapeutic activities;Balance training;Patient/family education    OT Goals(Current goals can be found in the care plan section) Acute Rehab OT Goals Patient Stated Goal: get back to her dogs (3 small dogs) OT Goal Formulation: With patient/family Time For Goal Achievement: 03/26/16 Potential to Achieve Goals: Good ADL Goals Pt Will Perform Upper Body Dressing: with min assist;sitting Pt Will Perform Lower Body Dressing: with min assist;sitting/lateral leans Pt Will Transfer to Toilet: with min assist;bedside commode;stand pivot transfer Pt Will Perform Toileting - Clothing Manipulation and hygiene: with supervision;sit to/from stand Pt Will Perform Tub/Shower Transfer: with min assist;rolling walker  OT Frequency: Min 2X/week   Barriers to D/C: Decreased caregiver support  Pt lives alone, family is not local and cannot stay with her       Co-evaluation              End of Session Nurse Communication: Mobility status  Activity Tolerance: Patient limited by pain Patient left: in bed;with call bell/phone within reach;with family/visitor present   Time: 1128-1227 OT Time Calculation (min): 59 min Charges:  OT General Charges $OT  Visit: 1 Procedure OT Evaluation $OT Eval Moderate Complexity: 1 Procedure OT Treatments $Self Care/Home Management : 23-37 mins $Therapeutic Activity: 8-22 mins G-Codes:    Merri Ray Kerah Hardebeck March 29, 2016, 5:01 PM  Hulda Humphrey OTR/L [phone]

## 2016-03-19 NOTE — NC FL2 (Signed)
[place] MEDICAID FL2 LEVEL OF CARE SCREENING TOOL     IDENTIFICATION  Patient Name: Crystal Trujillo Birthdate: [birth year] Sex: female Admission Date (Current Location): 03/17/2016  [facility] and [place] Number:  Producer, television/film/video and Address:  [facility] [place], [address], [place], [place] [address]      Provider Number: [id number]  Attending Physician Name and Address:  Catarina Hartshorn, MD  Relative Name and Phone Number:       Current Level of Care: SNF Recommended Level of Care: Skilled Nursing Facility Prior Approval Number:    Date Approved/Denied:   PASRR Number:  [id number])  Discharge Plan: SNF    Current Diagnoses: Patient Active Problem List   Diagnosis Date Noted  . Fall from ladder 03/18/2016  . Osteoporosis 03/18/2016  . Hypothyroidism 03/18/2016  . Hyperlipidemia 03/18/2016  . Closed compression fracture of L3 lumbar vertebra (HCC) 03/17/2016  . Effusion of knee joint 03/17/2015  . Metatarsalgia of both feet 03/17/2015    Orientation RESPIRATION BLADDER Height & Weight     Self, Time, Situation, Place   (2 Liters) Incontinent Weight: 134 lb (60.8 kg) Height:  5\' 1"  (154.9 cm)  BEHAVIORAL SYMPTOMS/MOOD NEUROLOGICAL BOWEL NUTRITION STATUS      Incontinent    AMBULATORY STATUS COMMUNICATION OF NEEDS Skin   Total Care Verbally Normal                       Personal Care Assistance Level of Assistance  Bathing, Dressing Bathing Assistance: Maximum assistance   Dressing Assistance: Maximum assistance     Functional Limitations Info             SPECIAL CARE FACTORS FREQUENCY                       Contractures      Additional Factors Info   (Full)               Current Medications (03/19/2016):  This is the current hospital active medication list Current Facility-Administered Medications  Medication Dose Route Frequency Provider Last Rate Last Dose  . 0.9 %  sodium chloride infusion   Intravenous  Continuous Clydie Braun, MD 75 mL/hr at 03/17/16 2339    . 0.9 %  sodium chloride infusion  250 mL Intravenous Continuous Maeola Harman, MD      . acetaminophen (TYLENOL) tablet 650 mg  650 mg Oral Q4H PRN Maeola Harman, MD       Or  . acetaminophen (TYLENOL) suppository 650 mg  650 mg Rectal Q4H PRN Maeola Harman, MD      . alum & mag hydroxide-simeth (MAALOX/MYLANTA) 200-200-20 MG/5ML suspension 30 mL  30 mL Oral Q6H PRN Maeola Harman, MD      . atorvastatin (LIPITOR) tablet 40 mg  40 mg Oral q1800 Clydie Braun, MD      . bisacodyl (DULCOLAX) suppository 10 mg  10 mg Rectal Daily PRN Maeola Harman, MD      . bupivacaine liposome (EXPAREL) 1.3 % injection 266 mg  20 mL Infiltration Once Maeola Harman, MD      . citalopram (CELEXA) tablet 20 mg  20 mg Oral Daily PRN Clydie Braun, MD      . cyclobenzaprine (FLEXERIL) tablet 7.5 mg  7.5 mg Oral TID PRN Clydie Braun, MD      . dextrose 5 % and 0.45 % NaCl with KCl 20 mEq/L infusion  Intravenous Continuous Maeola HarmanJoseph Stern, MD 75 mL/hr at 03/18/16 2249    . docusate sodium (COLACE) capsule 100 mg  100 mg Oral BID Maeola HarmanJoseph Stern, MD   100 mg at 03/19/16 0902  . enoxaparin (LOVENOX) injection 40 mg  40 mg Subcutaneous Q24H Clydie Braunondell A Smith, MD   40 mg at 03/18/16 2132  . famotidine (PEPCID) IVPB 20 mg premix  20 mg Intravenous Q12H Maeola HarmanJoseph Stern, MD   20 mg at 03/18/16 2230  . HYDROcodone-acetaminophen (NORCO/VICODIN) 5-325 MG per tablet 1-2 tablet  1-2 tablet Oral Q4H PRN Maeola HarmanJoseph Stern, MD      . HYDROmorphone (DILAUDID) injection 0.5-1 mg  0.5-1 mg Intravenous Q2H PRN Maeola HarmanJoseph Stern, MD   1 mg at 03/18/16 2008  . levothyroxine (SYNTHROID, LEVOTHROID) tablet 75 mcg  75 mcg Oral QPM Clydie Braunondell A Smith, MD   75 mcg at 03/17/16 2351  . menthol-cetylpyridinium (CEPACOL) lozenge 3 mg  1 lozenge Oral PRN Maeola HarmanJoseph Stern, MD       Or  . phenol (CHLORASEPTIC) mouth spray 1 spray  1 spray Mouth/Throat PRN Maeola HarmanJoseph Stern, MD      . methocarbamol (ROBAXIN) tablet 500 mg   500 mg Oral Q6H PRN Maeola HarmanJoseph Stern, MD       Or  . methocarbamol (ROBAXIN) 500 mg in dextrose 5 % 50 mL IVPB  500 mg Intravenous Q6H PRN Maeola HarmanJoseph Stern, MD      . morphine 2 MG/ML injection 2 mg  2 mg Intravenous Q2H PRN Clydie Braunondell A Smith, MD   2 mg at 03/19/16 1001  . ondansetron (ZOFRAN) tablet 4 mg  4 mg Oral Q6H PRN Clydie Braunondell A Smith, MD       Or  . ondansetron (ZOFRAN) injection 4 mg  4 mg Intravenous Q6H PRN Clydie Braunondell A Smith, MD      . ondansetron (ZOFRAN) injection 4 mg  4 mg Intravenous Q4H PRN Maeola HarmanJoseph Stern, MD      . oxyCODONE-acetaminophen (PERCOCET/ROXICET) 5-325 MG per tablet 1-2 tablet  1-2 tablet Oral Q4H PRN Maeola HarmanJoseph Stern, MD      . pantoprazole (PROTONIX) EC tablet 40 mg  40 mg Oral Daily Clydie Braunondell A Smith, MD   40 mg at 03/19/16 0902  . senna-docusate (Senokot-S) tablet 1 tablet  1 tablet Oral QHS PRN Maeola HarmanJoseph Stern, MD      . sodium chloride flush (NS) 0.9 % injection 3 mL  3 mL Intravenous Q12H Maeola HarmanJoseph Stern, MD   3 mL at 03/19/16 1000  . sodium chloride flush (NS) 0.9 % injection 3 mL  3 mL Intravenous PRN Maeola HarmanJoseph Stern, MD      . sodium phosphate (FLEET) 7-19 GM/118ML enema 1 enema  1 enema Rectal Once PRN Maeola HarmanJoseph Stern, MD      . traZODone (DESYREL) tablet 50 mg  50 mg Oral QHS Clydie Braunondell A Smith, MD   50 mg at 03/18/16 2132  . zolpidem (AMBIEN) tablet 5 mg  5 mg Oral QHS PRN Maeola HarmanJoseph Stern, MD         Discharge Medications: Please see discharge summary for a list of discharge medications.  Relevant Imaging Results:  Relevant Lab Results:   Additional Information  ([id number])  Alene MiresWhitaker, Darrion Macaulay R

## 2016-03-19 NOTE — Progress Notes (Signed)
No acute events Muscle spasms Moving legs well Keep drain

## 2016-03-19 NOTE — Progress Notes (Signed)
Physical Therapy Treatment Patient Details Name: Crystal Trujillo MRN: [id number] DOB: 1946/03/14 Today's Date: 03/19/2016    History of Present Illness 70 y.o. female s/p L1-5 posterior fusion following a fall from a ladder. PMH: osteoporosis.    PT Comments    Patient is s/p above surgery resulting in the deficits listed below (see PT Problem List). Heavy encouragement needed throughout session for participation. Pt refusing to attempt to stand at this time. Pt educated on need for mobilization. D/C plan is unclear at this time as pt lives alone but she states that she might have family she could stay with but she is not sure. No family present at this time.   Follow Up Recommendations  SNF;Supervision for mobility/OOB     Equipment Recommendations  Rolling walker with 5" wheels    Recommendations for Other Services       Precautions / Restrictions Precautions Precautions: Back Precaution Booklet Issued: Yes (comment) Precaution Comments: reviewed back precautions Other Brace/Splint: Has TLSO in room from ED, no order for brace use since surgery.  Restrictions Weight Bearing Restrictions: No    Mobility  Bed Mobility Overal bed mobility: Needs Assistance Bed Mobility: Rolling;Sidelying to Sit;Sit to Sidelying Rolling: Max assist Sidelying to sit: Max assist     Sit to sidelying: Max assist General bed mobility comments: Pt providing minimal assistance with mobility and heavy encouragement needed for pt participation needed throughout session. Able to sit EOB with pt refusing to attempt to lay down. Pt stating that she feels like she is "going to pass out" so assisted back to supine.   Transfers                 General transfer comment: Pt refusing to attempt.   Ambulation/Gait                 Stairs            Wheelchair Mobility    Modified Rankin (Stroke Patients Only)       Balance Overall balance assessment: Needs  assistance Sitting-balance support: Bilateral upper extremity supported Sitting balance-Leahy Scale: Poor Sitting balance - Comments: Despite cues, pt continuing to lean forward and twist to hold bed rail.                             Cognition Arousal/Alertness: Lethargic Behavior During Therapy: Anxious (with mobility) Overall Cognitive Status: Within Functional Limits for tasks assessed                      Exercises      General Comments General comments (skin integrity, edema, etc.): Heavy encouragement needed throughout session for pt participation.       Pertinent Vitals/Pain Pain Assessment: Faces Faces Pain Scale: Hurts whole lot Pain Location: back Pain Descriptors / Indicators: Grimacing;Guarding;Moaning Pain Intervention(s): Limited activity within patient's tolerance;Monitored during session    Home Living Family/patient expects to be discharged to:: Unsure Living Arrangements: Alone Available Help at Discharge: Other (Comment) (unsure) Type of Home: House Home Access: Level entry   Home Layout: One level Home Equipment: None Additional Comments: Reports that she might stay with family after D/C.     Prior Function Level of Independence: Independent          PT Goals (current goals can now be found in the care plan section) Acute Rehab PT Goals Patient Stated Goal: move better  PT Goal Formulation: With patient Time  For Goal Achievement: 04/02/16 Potential to Achieve Goals: Good    Frequency  Min 5X/week    PT Plan      Co-evaluation             End of Session Equipment Utilized During Treatment: Oxygen Activity Tolerance: Patient limited by pain Patient left: in bed;with SCD's reapplied     Time: 0811-0841 PT Time Calculation (min) (ACUTE ONLY): 30 min  Charges:  $Therapeutic Activity: 8-22 mins                    G Codes:      Christiane Ha, PT, CSCS Pager [phone] Office [phone]  03/19/2016,  9:00 AM

## 2016-03-19 NOTE — Progress Notes (Signed)
Pt refuses to remove foley and eat states she wants to lay flat and not move, encouraged pt move and educated pt on the reasons to move and eat.  Pt states I don"t care

## 2016-03-19 NOTE — Care Management Note (Signed)
Case Management Note  Patient Details  Name: Crystal Trujillo MRN: [id number] Date of Birth: Aug 01, 1945  Subjective/Objective:                    Action/Plan:   Expected Discharge Date:                  Expected Discharge Plan:  Skilled Nursing Facility  In-House Referral:  Clinical Social Work  Discharge planning Services     Post Acute Care Choice:    Choice offered to:     DME Arranged:    DME Agency:     HH Arranged:    HH Agency:     Status of Service:  In process, will continue to follow  If discussed at Long Length of Stay Meetings, dates discussed:    Additional Comments:  Kingsley PlanWile, Khandi Kernes Marie, RN 03/19/2016, 2:32 PM

## 2016-03-20 ENCOUNTER — Encounter (HOSPITAL_COMMUNITY): Payer: Self-pay | Admitting: Neurosurgery

## 2016-03-20 DIAGNOSIS — E876 Hypokalemia: Secondary | ICD-10-CM

## 2016-03-20 LAB — BASIC METABOLIC PANEL
Anion gap: 4 — ABNORMAL LOW (ref 5–15)
CALCIUM: 7.9 mg/dL — AB (ref 8.9–10.3)
CHLORIDE: 105 mmol/L (ref 101–111)
CO2: 29 mmol/L (ref 22–32)
CREATININE: 0.52 mg/dL (ref 0.44–1.00)
Glucose, Bld: 129 mg/dL — ABNORMAL HIGH (ref 65–99)
Potassium: 3.3 mmol/L — ABNORMAL LOW (ref 3.5–5.1)
SODIUM: 138 mmol/L (ref 135–145)

## 2016-03-20 LAB — MAGNESIUM: MAGNESIUM: 1.8 mg/dL (ref 1.7–2.4)

## 2016-03-20 MED ORDER — BISACODYL 10 MG RE SUPP
10.0000 mg | Freq: Once | RECTAL | Status: AC
  Administered 2016-03-20: 10 mg via RECTAL
  Filled 2016-03-20: qty 1

## 2016-03-20 MED ORDER — POTASSIUM CHLORIDE CRYS ER 20 MEQ PO TBCR
40.0000 meq | EXTENDED_RELEASE_TABLET | Freq: Once | ORAL | Status: AC
  Administered 2016-03-20: 40 meq via ORAL
  Filled 2016-03-20: qty 2

## 2016-03-20 MED ORDER — FAMOTIDINE 20 MG PO TABS
20.0000 mg | ORAL_TABLET | Freq: Two times a day (BID) | ORAL | Status: DC
  Administered 2016-03-20 – 2016-03-22 (×5): 20 mg via ORAL
  Filled 2016-03-20 (×5): qty 1

## 2016-03-20 MED FILL — Heparin Sodium (Porcine) Inj 1000 Unit/ML: INTRAMUSCULAR | Qty: 30 | Status: AC

## 2016-03-20 MED FILL — Sodium Chloride IV Soln 0.9%: INTRAVENOUS | Qty: 2000 | Status: AC

## 2016-03-20 NOTE — Progress Notes (Addendum)
PROGRESS NOTE  Crystal Trujillo [id number] DOB: 07-Apr-1946 DOA: 03/17/2016 PCP: Gaspar Garbeichard W Tisovec, MD  Brief History:  70 year old female with a history of osteoporosis, hypothyroidism, hyperlipidemia presented after a mechanical fall at home. The patient was on a ladder changing her smoke detector batteries when she lost her footing and fell onto the ground. There was no loss of consciousness. Because of intense pain, the patient was unable to move and laid on the floor for approximately 2 hours until her neighbors heard her yelling for help. Upon presentation to the emergency department CT of the lumbar spine revealed L3 burst fracture with a 60% height loss. Neurosurgery was consulted, and Dr. Venetia MaxonStern evaluated the patient and placed the patient in TLSO brace initially. The patient was subsequently taken to surgery on 03/18/2016 where she had a lumbar decompression with pedicle screw fixation and posterolateral arthrodesis.  Assessment/Plan: Compression fracture L3 -03/18/2016--lumbar decompression with pedicle screw fixation and posterolateral arthrodesis--Dr. Venetia MaxonStern -appreciate neurosurgery -soma added by neurosurgery -pain control--better -PT/OT -stool softener  Hypothyroidism -Continue Synthroid  Hyperlipidemia -Continue statin  Anxiety/depression -continue trazodone and citalopram  Constipation -continue colace and senna -add bisacodyl suppository x 1  Hypokalemia -replete -check mag    Disposition Plan:   SNF 9/6 cleared by neurosurgery Family Communication:   Family at bedside updated 03/19/16  Consultants:  Neurosurgery  Code Status:  FULL   DVT Prophylaxis:   [place] Lovenox   Procedures: As Listed in Progress Note Above  Antibiotics: None   Subjective: Patient complains of back pain but states that the radicular pain is improved. Denies any fevers, chest pain, shortness breath, nausea, vomiting, diarrhea. She feels constipated. Denies any  abdominal pain, hematochezia, melena  Objective: Vitals:   03/19/16 2035 03/19/16 2236 03/20/16 0700 03/20/16 1300  BP: (!) 115/53  107/86 (!) 110/50  Pulse: 97  90 99  Resp: 16  16 16   Temp: 100 F (37.8 C) 99.7 F (37.6 C) 97.5 F (36.4 C) 97.3 F (36.3 C)  TempSrc: Oral  Axillary Axillary  SpO2: 96%  94% 91%  Weight:      Height:        Intake/Output Summary (Last 24 hours) at 03/20/16 1527 Last data filed at 03/20/16 1300  Gross per 24 hour  Intake           1602.5 ml  Output              500 ml  Net           1102.5 ml   Weight change:  Exam:   General:  Pt is alert, follows commands appropriately, not in acute distress  HEENT: No icterus, No thrush, No neck mass, [place]/AT  Cardiovascular: RRR, S1/S2, no rubs, no gallops  Respiratory: Bibasilar crackles. No wheezing. Good air movement.  Abdomen: Soft/+BS, non tender, non distended, no guarding  Extremities: trace LE edema, No lymphangitis, No petechiae, No rashes, no synovitis   Data Reviewed: I have personally reviewed following labs and imaging studies Basic Metabolic Panel:  Recent Labs Lab 03/17/16 2015 03/18/16 0306 03/20/16 0456  NA 135 134* 138  K 4.1 3.8 3.3*  CL 106 103 105  CO2 24 24 29   GLUCOSE 120* 130* 129*  BUN 14 14 <5*  CREATININE 0.65 0.60 0.52  CALCIUM 8.8* 8.5* 7.9*  MG  --   --  1.8   Liver Function Tests: No results for input(s): AST, ALT, ALKPHOS, BILITOT, PROT, ALBUMIN  in the last 168 hours. No results for input(s): LIPASE, AMYLASE in the last 168 hours. No results for input(s): AMMONIA in the last 168 hours. Coagulation Profile: No results for input(s): INR, PROTIME in the last 168 hours. CBC:  Recent Labs Lab 03/17/16 2015 03/18/16 0306  WBC 9.9 9.1  NEUTROABS 8.2*  --   HGB 13.1 12.3  HCT 39.8 39.0  MCV 92.8 94.7  PLT 254 253   Cardiac Enzymes: No results for input(s): CKTOTAL, CKMB, CKMBINDEX, TROPONINI in the last 168 hours. BNP: Invalid input(s):  POCBNP CBG: No results for input(s): GLUCAP in the last 168 hours. HbA1C: No results for input(s): HGBA1C in the last 72 hours. Urine analysis: No results found for: COLORURINE, APPEARANCEUR, LABSPEC, PHURINE, GLUCOSEU, HGBUR, BILIRUBINUR, KETONESUR, PROTEINUR, UROBILINOGEN, NITRITE, LEUKOCYTESUR Sepsis Labs: @LABRCNTIP (procalcitonin:4,lacticidven:4) ) Recent Results (from the past 240 hour(s))  Surgical pcr screen     Status: None   Collection Time: 03/18/16 10:28 AM  Result Value Ref Range Status   MRSA, PCR NEGATIVE NEGATIVE Final   Staphylococcus aureus NEGATIVE NEGATIVE Final    Comment:        The Xpert SA Assay (FDA approved for NASAL specimens in patients over 86 years of age), is one component of a comprehensive surveillance program.  Test performance has been validated by [facility] for patients greater than or equal to 24 year old. It is not intended to diagnose infection nor to guide or monitor treatment.      Scheduled Meds: . atorvastatin  40 mg Oral q1800  . bupivacaine liposome  20 mL Infiltration Once  . carisoprodol  350 mg Oral TID  . citalopram  20 mg Oral Daily  . docusate sodium  100 mg Oral BID  . enoxaparin (LOVENOX) injection  40 mg Subcutaneous Q24H  . famotidine  20 mg Oral BID  . levothyroxine  75 mcg Oral QPM  . pantoprazole  40 mg Oral Daily  . potassium chloride  40 mEq Oral Once  . senna  2 tablet Oral Daily  . sodium chloride flush  3 mL Intravenous Q12H  . traZODone  50 mg Oral QHS   Continuous Infusions: . sodium chloride 75 mL/hr at 03/17/16 2339  . sodium chloride    . dextrose 5 % and 0.45 % NaCl with KCl 20 mEq/L 75 mL/hr at 03/18/16 2249    Procedures/Studies: Dg Lumbar Spine Complete  Result Date: 03/18/2016 CLINICAL DATA:  Posterior lumbar fusion with L1-5 decompression EXAM: DG C-ARM 61-120 MIN; LUMBAR SPINE - COMPLETE 4+ VIEW FLUOROSCOPY TIME:  52 seconds COMPARISON:  CT lumbar spine dated 03/17/2016 FINDINGS:  Intraoperative fluoroscopic spot radiographs during posterior lumbar fixation. Mild superior endplate compression fracture deformity at L3. Pedicle screws at L1 and L2. IMPRESSION: Intraoperative fluoroscopic spot radiographs during posterior lumbar fixation, as above. Electronically Signed   By: Charline Bills M.D.   On: 03/18/2016 14:39   Dg Lumbar Spine Complete  Result Date: 03/17/2016 CLINICAL DATA:  Pt states that she fell off of a 5 foot ladder this morning while trying to change the battery in the smoke detector. When she fell backwards she hit her back on the wall and fell to floor landing on her side. Pain in lower back and pelvis. Pain comes and goes and is excruciating. Limited mobility. EXAM: LUMBAR SPINE - COMPLETE 4+ VIEW COMPARISON:  None. FINDINGS: Acute fracture of L3. There is proximally 40% loss of anterior height. No definite retropulsion of fracture fragments. However, detail is limited  given the portable nature of the exam. The fractures are identified. No spondylolisthesis. IMPRESSION: Acute fracture of L3. Electronically Signed   By: Norva Pavlov M.D.   On: 03/17/2016 13:32   Ct Lumbar Spine Wo Contrast  Result Date: 03/17/2016 CLINICAL DATA:  Fall 15 feet from a ladder. Low back pain. L3 fracture on radiographs. EXAM: CT LUMBAR SPINE WITHOUT CONTRAST TECHNIQUE: Multidetector CT imaging of the lumbar spine was performed without intravenous contrast administration. Multiplanar CT image reconstructions were also generated. COMPARISON:  Lumbar spine radiographs earlier today FINDINGS: Lumbar segmentation is normal. As seen on earlier radiographs, there is an L3 vertebral body burst fracture with approximately 60% height loss centrally. Posterior vertebral body fragments are retropulsed approximately 6 mm into the canal with resultant mild to moderate spinal stenosis. No posterior element fracture is identified. Vertebral body heights are preserved elsewhere in the lumbar spine  without additional fractures identified. Intervertebral disc space heights are preserved. There is no significant listhesis. Multilevel facet arthrosis is noted, moderate at L5-S1. Abdominal aortic atherosclerosis is noted. IMPRESSION: 1. L3 burst fracture with 60% height loss, 6 mm retropulsion, and mild-to-moderate spinal stenosis. 2. Aortic atherosclerosis. Electronically Signed   By: Sebastian Ache M.D.   On: 03/17/2016 16:10   Dg C-arm 1-60 Min  Result Date: 03/18/2016 CLINICAL DATA:  Posterior lumbar fusion with L1-5 decompression EXAM: DG C-ARM 61-120 MIN; LUMBAR SPINE - COMPLETE 4+ VIEW FLUOROSCOPY TIME:  52 seconds COMPARISON:  CT lumbar spine dated 03/17/2016 FINDINGS: Intraoperative fluoroscopic spot radiographs during posterior lumbar fixation. Mild superior endplate compression fracture deformity at L3. Pedicle screws at L1 and L2. IMPRESSION: Intraoperative fluoroscopic spot radiographs during posterior lumbar fixation, as above. Electronically Signed   By: Charline Bills M.D.   On: 03/18/2016 14:39   Dg Hips Bilat W Or Wo Pelvis 3-4 Views  Result Date: 03/17/2016 CLINICAL DATA:  Pt states that she fell off of a 5 foot ladder this morning while trying to change the battery in the smoke detector. When she fell backwards she hit her back on the wall and fell to floor landing on her side. Pain in the lower back and pelvis comes and goes and is excruciating. EXAM: DG HIP (WITH OR WITHOUT PELVIS) 3-4V BILAT COMPARISON:  Lumbar spine same day FINDINGS: There is no evidence of hip fracture or dislocation. There is no evidence of arthropathy or other focal bone abnormality. IMPRESSION: Negative. Electronically Signed   By: Norva Pavlov M.D.   On: 03/17/2016 13:34    Soriya Worster, DO  [facility] Pager [phone]  If 7PM-7AM, please contact night-coverage [email or web address] Password [email or web address] 03/20/2016, 3:27 PM   LOS: 2 days

## 2016-03-20 NOTE — Progress Notes (Signed)
Physical Therapy Treatment Patient Details Name: Crystal Trujillo MRN: [id number] DOB: 11/04/1945 Today's Date: 03/20/2016    History of Present Illness 70 y.o. female s/p L1-5 posterior fusion following a fall from a ladder. PMH: osteoporosis.    PT Comments    Pt performed limited treatment due to lethargy.  Pt slurring words and intermittently sleeping.  No signs of pain.  Informed nurse of status.  Pt left in sidelying to sleep.    Follow Up Recommendations  SNF;Supervision for mobility/OOB     Equipment Recommendations  Rolling walker with 5" wheels    Recommendations for Other Services       Precautions / Restrictions Precautions Precautions: Back;Fall Precaution Booklet Issued: Yes (comment) Precaution Comments: reviewed back precautions Required Braces or Orthoses: Spinal Brace Spinal Brace: Thoracolumbosacral orthotic Other Brace/Splint: mobilize with LSO in PA notes Restrictions Weight Bearing Restrictions: No    Mobility  Bed Mobility Overal bed mobility: Needs Assistance Bed Mobility: Sit to Sidelying         Sit to sidelying: Max assist General bed mobility comments: Required max VCs with max assistant to maintain spinal precautions once returning to bed.  Pt refused to roll on back and remained in sidelying position with pillow between knees.    Transfers Overall transfer level: Needs assistance Equipment used: Rolling walker (2 wheeled) Transfers: Sit to/from Stand Sit to Stand: Max assist         General transfer comment: Pt required increased attempts and max cues for technique.  Increased assist due to sedated presentation.  Pt required sternal rubs and noxious stimuli to wake up.  Intermittently falling asleep in standing.    Ambulation/Gait Ambulation/Gait assistance: Mod assist Ambulation Distance (Feet): 75 Feet Assistive device: Rolling walker (2 wheeled) Gait Pattern/deviations: Step-through pattern;Trunk flexed;Staggering right;Staggering  left;Drifts right/left   Gait velocity interpretation: Below normal speed for age/gender General Gait Details: Pt pushing RW anteriorly and required frequent cueing to stand tall.  Pt reports in standing, "If I am up I am walking in this hall."  Pt performed gait training with some what alert presentation.  Pt required close chair follow for safety.     Stairs            Wheelchair Mobility    Modified Rankin (Stroke Patients Only)       Balance                                    Cognition Arousal/Alertness: Lethargic;Suspect due to medications Behavior During Therapy: Impulsive Overall Cognitive Status: Difficult to assess Area of Impairment: Orientation;Attention;Following commands;Safety/judgement Orientation Level: Situation;Time Current Attention Level: Sustained Memory: Decreased recall of precautions Following Commands: Follows one step commands with increased time       General Comments: Intermittently falling asleep, patient severely drowsy.      Exercises      General Comments        Pertinent Vitals/Pain Pain Assessment: Faces Pain Score: 0-No pain Pain Location: Back Pain Descriptors / Indicators: Sore Pain Intervention(s): Monitored during session;Repositioned    Home Living                      Prior Function            PT Goals (current goals can now be found in the care plan section) Acute Rehab PT Goals Patient Stated Goal: to walk in the hall Potential to Achieve  Goals: Good Progress towards PT goals: Progressing toward goals    Frequency  Min 5X/week    PT Plan Current plan remains appropriate    Co-evaluation             End of Session Equipment Utilized During Treatment: Gait belt Activity Tolerance: Patient limited by lethargy Patient left: in bed;with call bell/phone within reach     Time: 1112-1129 PT Time Calculation (min) (ACUTE ONLY): 17 min  Charges:  $Gait Training: 8-22  mins                    G Codes:      Florestine Avers 04/10/16, 11:48 AM  Joycelyn Rua, PTA pager [phone]

## 2016-03-20 NOTE — Progress Notes (Addendum)
Subjective: Patient reports "I'm proud of myself. I just ate for the first time since surgery. I finally have an appetite"  Objective: Vital signs in last 24 hours: Temp:  [97.5 F (36.4 C)-100 F (37.8 C)] 97.5 F (36.4 C) (09/05 0700) Pulse Rate:  [90-97] 90 (09/05 0700) Resp:  [16-18] 16 (09/05 0700) BP: (107-115)/(39-86) 107/86 (09/05 0700) SpO2:  [94 %-98 %] 94 % (09/05 0700)  Intake/Output from previous day: 09/04 0701 - 09/05 0700 In: 5457.5 [P.O.:600; I.V.:4807.5; IV Piggyback:50] Out: 1350 [Urine:1275; Drains:75] Intake/Output this shift: No intake/output data recorded.  Alert, conversant. MAEW. Good strength BLE. States she has not been OOB yet d/t pain. Currently not reporting significant pain, but voices concern for lumbar pain with positioning in bed. She logrolls for drain removal without c/o pain. Site without erythema, swelling, or drainage. Drain intact, patent. Sats dip into the mid 80's at rest.   Lab Results:  Recent Labs  03/17/16 2015 03/18/16 0306  WBC 9.9 9.1  HGB 13.1 12.3  HCT 39.8 39.0  PLT 254 253   BMET  Recent Labs  03/18/16 0306 03/20/16 0456  NA 134* 138  K 3.8 3.3*  CL 103 105  CO2 24 29  GLUCOSE 130* 129*  BUN 14 <5*  CREATININE 0.60 0.52  CALCIUM 8.5* 7.9*    Studies/Results: Dg Lumbar Spine Complete  Result Date: 03/18/2016 CLINICAL DATA:  Posterior lumbar fusion with L1-5 decompression EXAM: DG C-ARM 61-120 MIN; LUMBAR SPINE - COMPLETE 4+ VIEW FLUOROSCOPY TIME:  52 seconds COMPARISON:  CT lumbar spine dated 03/17/2016 FINDINGS: Intraoperative fluoroscopic spot radiographs during posterior lumbar fixation. Mild superior endplate compression fracture deformity at L3. Pedicle screws at L1 and L2. IMPRESSION: Intraoperative fluoroscopic spot radiographs during posterior lumbar fixation, as above. Electronically Signed   By: Charline BillsSriyesh  Krishnan M.D.   On: 03/18/2016 14:39   Dg C-arm 1-60 Min  Result Date: 03/18/2016 CLINICAL DATA:   Posterior lumbar fusion with L1-5 decompression EXAM: DG C-ARM 61-120 MIN; LUMBAR SPINE - COMPLETE 4+ VIEW FLUOROSCOPY TIME:  52 seconds COMPARISON:  CT lumbar spine dated 03/17/2016 FINDINGS: Intraoperative fluoroscopic spot radiographs during posterior lumbar fixation. Mild superior endplate compression fracture deformity at L3. Pedicle screws at L1 and L2. IMPRESSION: Intraoperative fluoroscopic spot radiographs during posterior lumbar fixation, as above. Electronically Signed   By: Charline BillsSriyesh  Krishnan M.D.   On: 03/18/2016 14:39    Assessment/Plan: Improving  LOS: 2 days  She tolerated hemovac removal without complaint. We discussed at length her need to mobilize with PT and/or nursing staff. Reassured re: expectation of decreasing pain.  Continue to mobilize in LSO.    PoteatArlys John, Brian 03/20/2016, 8:16 AM   Drain discontinued, mobilize with PT.  Patient is doing well.

## 2016-03-20 NOTE — Progress Notes (Signed)
   03/20/16 1100  OT Visit Information  Last OT Received On 03/20/16  Assistance Needed +1  History of Present Illness 70 y.o. female s/p L1-5 posterior fusion following a fall from a ladder. PMH: osteoporosis.  Precautions  Precautions Back;Fall  Precaution Booklet Issued Yes (comment)  Precaution Comments reviewed back precautions  Required Braces or Orthoses Spinal Brace  Spinal Brace TLSO  Other Brace/Splint mobilize with LSO in PA notes  Pain Assessment  Pain Assessment 0-10  Pain Score 1  Pain Location back  Pain Descriptors / Indicators Sore  Pain Intervention(s) Monitored during session;Premedicated before session;Repositioned  Cognition  Arousal/Alertness Awake/alert  Behavior During Therapy Impulsive  Overall Cognitive Status Impaired/Different from baseline  Area of Impairment Orientation;Attention;Following commands;Safety/judgement  Orientation Level Disoriented to;Time  Current Attention Level Sustained  Following Commands Follows one step commands with increased time (and multimodal cues)  Safety/Judgement Decreased awareness of safety  ADL  Overall ADL's  Needs assistance/impaired  Eating/Feeding Independent;Sitting  Grooming Wash/dry face;Oral care;Set up;Brushing hair;Sitting;Moderate assistance  Upper Body Bathing Minimal assitance;Sitting  Upper Body Bathing Details (indicate cue type and reason) assist for back  Lower Body Bathing Maximal assistance;Sit to/from stand  Lower Body Bathing Details (indicate cue type and reason) performed pericare with encouragement in standing  Upper Body Dressing  Maximal assistance;Sitting  Upper Body Dressing Details (indicate cue type and reason) for TLSO  Lower Body Dressing Total assistance;Sit to/from Scientist, research (life sciences)stand  Toilet Transfer Minimal assistance;Ambulation;RW  Toileting- Clothing Manipulation and Hygiene Minimal assistance;Sit to/from stand  Functional mobility during ADLs Minimal assistance;Rolling walker (walked to  door)  Bed Mobility  Overal bed mobility Needs Assistance  Bed Mobility Rolling;Sidelying to Sit  Rolling Min assist  Sidelying to sit Max assist  General bed mobility comments assist to scoot hips to EOB and to raise trunk, cues for technique  Balance  Overall balance assessment Needs assistance  Sitting balance-Leahy Scale Poor  Standing balance-Leahy Scale Poor  Restrictions  Weight Bearing Restrictions No  Transfers  Equipment used Rolling walker (2 wheeled)  Transfers Sit to/from Stand  Sit to Stand +2 physical assistance;Min assist  General transfer comment +2 min initially from EOB, +1 min from chair, increased time, cues for hand placement  OT - End of Session  Equipment Utilized During Treatment Gait belt;Rolling walker;Back brace  Activity Tolerance Patient tolerated treatment well  Patient left in chair;with call bell/phone within reach  Nurse Communication Mobility status  OT Assessment/Plan  OT Plan Discharge plan remains appropriate  OT Frequency (ACUTE ONLY) Min 2X/week  Follow Up Recommendations SNF;Supervision/Assistance - 24 hour  OT Goal Progression  Progress towards OT goals Progressing toward goals  Acute Rehab OT Goals  Time For Goal Achievement 03/26/16  Potential to Achieve Goals Good  OT Time Calculation  OT Start Time (ACUTE ONLY) 0836  OT Stop Time (ACUTE ONLY) 0920  OT Time Calculation (min) 44 min  OT General Charges  $OT Visit 1 Procedure  OT Treatments  $Self Care/Home Management  38-52 mins  03/20/2016 Martie RoundJulie Harshika Mago, OTR/L Pager: [phone]

## 2016-03-21 DIAGNOSIS — E785 Hyperlipidemia, unspecified: Secondary | ICD-10-CM

## 2016-03-21 DIAGNOSIS — S32030K Wedge compression fracture of third lumbar vertebra, subsequent encounter for fracture with nonunion: Secondary | ICD-10-CM

## 2016-03-21 DIAGNOSIS — E038 Other specified hypothyroidism: Secondary | ICD-10-CM

## 2016-03-21 LAB — BASIC METABOLIC PANEL
ANION GAP: 7 (ref 5–15)
BUN: 6 mg/dL (ref 6–20)
CHLORIDE: 100 mmol/L — AB (ref 101–111)
CO2: 27 mmol/L (ref 22–32)
Calcium: 7.9 mg/dL — ABNORMAL LOW (ref 8.9–10.3)
Creatinine, Ser: 0.5 mg/dL (ref 0.44–1.00)
GFR calc Af Amer: >60 mL/min (ref >60)
GLUCOSE: 121 mg/dL — AB (ref 65–99)
POTASSIUM: 3.7 mmol/L (ref 3.5–5.1)
Sodium: 134 mmol/L — ABNORMAL LOW (ref 135–145)

## 2016-03-21 LAB — MAGNESIUM: Magnesium: 1.8 mg/dL (ref 1.7–2.4)

## 2016-03-21 MED ORDER — DOCUSATE SODIUM 100 MG PO CAPS: 100.0000 mg | ORAL_CAPSULE | Freq: Two times a day (BID) | ORAL | 0 refills | Status: AC

## 2016-03-21 MED ORDER — DIAZEPAM 5 MG PO TABS
5.0000 mg | ORAL_TABLET | Freq: Four times a day (QID) | ORAL | Status: DC | PRN
Start: 2016-03-21 — End: 2016-03-22
  Administered 2016-03-21 – 2016-03-22 (×2): 5 mg via ORAL
  Filled 2016-03-21 (×2): qty 1

## 2016-03-21 MED ORDER — CARISOPRODOL 350 MG PO TABS: 350.0000 mg | ORAL_TABLET | Freq: Three times a day (TID) | ORAL | 0 refills | Status: AC

## 2016-03-21 MED ORDER — TIZANIDINE HCL 2 MG PO TABS: 2.0000 mg | ORAL_TABLET | Freq: Four times a day (QID) | ORAL | 0 refills | Status: AC | PRN

## 2016-03-21 MED ORDER — TIZANIDINE HCL 4 MG PO TABS: 2.0000 mg | ORAL_TABLET | Freq: Four times a day (QID) | ORAL | Status: DC | PRN

## 2016-03-21 MED ORDER — DIAZEPAM 5 MG PO TABS: 5.0000 mg | ORAL_TABLET | Freq: Four times a day (QID) | ORAL | 0 refills | Status: AC | PRN

## 2016-03-21 MED ORDER — ACETAMINOPHEN 325 MG PO TABS: 650.0000 mg | ORAL_TABLET | Freq: Four times a day (QID) | ORAL | Status: AC | PRN

## 2016-03-21 MED ORDER — METHOCARBAMOL 500 MG PO TABS: 500.0000 mg | ORAL_TABLET | Freq: Four times a day (QID) | ORAL | Status: DC | PRN

## 2016-03-21 MED ORDER — BISACODYL 10 MG RE SUPP: 10.0000 mg | Freq: Every day | RECTAL | 0 refills | Status: AC | PRN

## 2016-03-21 NOTE — Progress Notes (Signed)
Subjective: Patient reports back spasms still painful, but sciatica improved  Objective: Vital signs in last 24 hours: Temp:  [97.3 F (36.3 C)-99.7 F (37.6 C)] 99.7 F (37.6 C) (09/06 0455) Pulse Rate:  [85-99] 85 (09/06 0455) Resp:  [16] 16 (09/06 0455) BP: (108-111)/(47-54) 108/54 (09/06 0455) SpO2:  [91 %-96 %] 96 % (09/06 0455)  Intake/Output from previous day: 09/05 0701 - 09/06 0700 In: 720 [P.O.:720] Out: -  Intake/Output this shift: No intake/output data recorded.  Physical Exam: Strength full, up in brace.  Lab Results: No results for input(s): WBC, HGB, HCT, PLT in the last 72 hours. BMET  Recent Labs  03/20/16 0456 03/21/16 0620  NA 138 134*  K 3.3* 3.7  CL 105 100*  CO2 29 27  GLUCOSE 129* 121*  BUN <5* 6  CREATININE 0.52 0.50  CALCIUM 7.9* 7.9*    Studies/Results: No results found.  Assessment/Plan: Continue to mobilize, try tizanidine, robaxin , or valium for spasm.  OK to tx to SNF when pain better controlled.    LOS: 3 days    Dorian HeckleSTERN,Seynabou Fults D, MD 03/21/2016, 8:21 AM

## 2016-03-21 NOTE — Care Management Important Message (Signed)
Important Message  Patient Details  Name: Konrad Sahanne Dillon MRN: [id number] Date of Birth: [birth year]   Medicare Important Message Given:       Dorena Bodoris Campbell Kray 03/21/2016, 9:47 AM

## 2016-03-21 NOTE — Care Management Important Message (Signed)
Important Message  Patient Details  Name: Crystal Trujillo MRN: [id number] Date of Birth: 08-16-1945   Medicare Important Message Given:  Yes    Hance Caspers Stefan ChurchBratton 03/21/2016, 9:47 AM

## 2016-03-21 NOTE — Progress Notes (Signed)
PROGRESS NOTE  Crystal Trujillo UYQ:[id number] DOB: 03/23/1946 DOA: 03/17/2016 PCP: Gaspar Garbeichard W Tisovec, MD  Brief History:  70 year old female with a history of osteoporosis, hypothyroidism, hyperlipidemia presented after a mechanical fall at home. The patient was on a ladder changing her smoke detector batteries when she lost her footing and fell onto the ground. There was no loss of consciousness. Because of intense pain, the patient was unable to move and laid on the floor for approximately 2 hours until her neighbors heard her yelling for help. Upon presentation to the emergency department CT of the lumbar spine revealed L3 burst fracture with a 60% height loss. Neurosurgery was consulted, and Dr. Venetia MaxonStern evaluated the patient and placed the patient in TLSO brace initially. The patient was subsequently taken to surgery on 03/18/2016 where she had a lumbar decompression with pedicle screw fixation and posterolateral arthrodesis.  Assessment/Plan: Compression fracture L3 -03/18/2016--status post lumbar decompression with pedicle screw fixation and posterolateral arthrodesis--Dr. Venetia MaxonStern -Neurosurgery is following. Patient continues to have significant pain issues. She states that she has been unable to transfer herself out of the bed. She is on muscle relaxants which have been added by neurosurgery. Neurosurgery would like for the pain to be better controlled before she is discharged. Continue stool softeners.  Hypothyroidism -Continue Synthroid  Hyperlipidemia -Continue statin  Anxiety/depression -continue trazodone and citalopram  Constipation -continue colace and senna  Hypokalemia Potassium has been repleted. Magnesium is 1.8.  Disposition Plan:    plan is for discharge to SNF once cleared by neurosurgery. Anticipate tomorrow. Family Communication:    discussed with patient  Consultants:  Neurosurgery  Code Status:  FULL   DVT Prophylaxis:     Lovenox   Procedures: As Listed in Progress Note Above  Antibiotics: None   Subjective: Patient continues to have severe back pain. She tells me that the pain tends to radiate down to her right leg. She denies any shortness of breath, nausea, vomiting. No chest pains. No abdominal pain.    Objective: Vitals:   03/20/16 0700 03/20/16 1300 03/20/16 2228 03/21/16 0455  BP: 107/86 (!) 110/50 (!) 111/47 (!) 108/54  Pulse: 90 99 87 85  Resp: 16 16 16 16   Temp: 97.5 F (36.4 C) 97.3 F (36.3 C) 99.6 F (37.6 C) 99.7 F (37.6 C)  TempSrc: Axillary Axillary Oral Oral  SpO2: 94% 91% 95% 96%  Weight:      Height:        Intake/Output Summary (Last 24 hours) at 03/21/16 1050 Last data filed at 03/21/16 0900  Gross per 24 hour  Intake              480 ml  Output                0 ml  Net              480 ml   Weight change:   Exam:   General:  She is somewhat somnolent this morning. Easily arousable. In no distress.   HEENT: No icterus, No thrush, No neck mass, [place]/AT  Cardiovascular: RRR, S1/S2, no rubs, no gallops  Respiratory: Bibasilar crackles. No wheezing. Good air movement.  Abdomen: Soft/+BS, non tender, non distended, no guarding  Neurological: Somnolent but easily arousable. No focal neurological deficits.   Data Reviewed: I have personally reviewed following labs and imaging studies  Basic Metabolic Panel:  Recent Labs Lab 03/17/16 2015 03/18/16 0306 03/20/16 0456 03/21/16 [id number]  NA 135 134* 138 134*  K 4.1 3.8 3.3* 3.7  CL 106 103 105 100*  CO2 24 24 29 27   GLUCOSE 120* 130* 129* 121*  BUN 14 14 <5* 6  CREATININE 0.65 0.60 0.52 0.50  CALCIUM 8.8* 8.5* 7.9* 7.9*  MG  --   --  1.8 1.8   CBC:  Recent Labs Lab 03/17/16 2015 03/18/16 0306  WBC 9.9 9.1  NEUTROABS 8.2*  --   HGB 13.1 12.3  HCT 39.8 39.0  MCV 92.8 94.7  PLT 254 253    Recent Results (from the past 240 hour(s))  Surgical pcr screen     Status: None   Collection Time:  03/18/16 10:28 AM  Result Value Ref Range Status   MRSA, PCR NEGATIVE NEGATIVE Final   Staphylococcus aureus NEGATIVE NEGATIVE Final    Comment:        The Xpert SA Assay (FDA approved for NASAL specimens in patients over 49 years of age), is one component of a comprehensive surveillance program.  Test performance has been validated by [facility] for patients greater than or equal to 80 year old. It is not intended to diagnose infection nor to guide or monitor treatment.      Scheduled Meds: . atorvastatin  40 mg Oral q1800  . bupivacaine liposome  20 mL Infiltration Once  . carisoprodol  350 mg Oral TID  . citalopram  20 mg Oral Daily  . docusate sodium  100 mg Oral BID  . enoxaparin (LOVENOX) injection  40 mg Subcutaneous Q24H  . famotidine  20 mg Oral BID  . levothyroxine  75 mcg Oral QPM  . pantoprazole  40 mg Oral Daily  . senna  2 tablet Oral Daily  . sodium chloride flush  3 mL Intravenous Q12H  . traZODone  50 mg Oral QHS   Continuous Infusions: . sodium chloride 75 mL/hr at 03/17/16 2339  . sodium chloride    . dextrose 5 % and 0.45 % NaCl with KCl 20 mEq/L 75 mL/hr at 03/18/16 2249    Procedures/Studies: Dg Lumbar Spine Complete  Result Date: 03/18/2016 CLINICAL DATA:  Posterior lumbar fusion with L1-5 decompression EXAM: DG C-ARM 61-120 MIN; LUMBAR SPINE - COMPLETE 4+ VIEW FLUOROSCOPY TIME:  52 seconds COMPARISON:  CT lumbar spine dated 03/17/2016 FINDINGS: Intraoperative fluoroscopic spot radiographs during posterior lumbar fixation. Mild superior endplate compression fracture deformity at L3. Pedicle screws at L1 and L2. IMPRESSION: Intraoperative fluoroscopic spot radiographs during posterior lumbar fixation, as above. Electronically Signed   By: Charline Bills M.D.   On: 03/18/2016 14:39   Dg Lumbar Spine Complete  Result Date: 03/17/2016 CLINICAL DATA:  Pt states that she fell off of a 5 foot ladder this morning while trying to change the battery in the  smoke detector. When she fell backwards she hit her back on the wall and fell to floor landing on her side. Pain in lower back and pelvis. Pain comes and goes and is excruciating. Limited mobility. EXAM: LUMBAR SPINE - COMPLETE 4+ VIEW COMPARISON:  None. FINDINGS: Acute fracture of L3. There is proximally 40% loss of anterior height. No definite retropulsion of fracture fragments. However, detail is limited given the portable nature of the exam. The fractures are identified. No spondylolisthesis. IMPRESSION: Acute fracture of L3. Electronically Signed   By: Norva Pavlov M.D.   On: 03/17/2016 13:32   Ct Lumbar Spine Wo Contrast  Result Date: 03/17/2016 CLINICAL DATA:  Fall 15 feet from a  ladder. Low back pain. L3 fracture on radiographs. EXAM: CT LUMBAR SPINE WITHOUT CONTRAST TECHNIQUE: Multidetector CT imaging of the lumbar spine was performed without intravenous contrast administration. Multiplanar CT image reconstructions were also generated. COMPARISON:  Lumbar spine radiographs earlier today FINDINGS: Lumbar segmentation is normal. As seen on earlier radiographs, there is an L3 vertebral body burst fracture with approximately 60% height loss centrally. Posterior vertebral body fragments are retropulsed approximately 6 mm into the canal with resultant mild to moderate spinal stenosis. No posterior element fracture is identified. Vertebral body heights are preserved elsewhere in the lumbar spine without additional fractures identified. Intervertebral disc space heights are preserved. There is no significant listhesis. Multilevel facet arthrosis is noted, moderate at L5-S1. Abdominal aortic atherosclerosis is noted. IMPRESSION: 1. L3 burst fracture with 60% height loss, 6 mm retropulsion, and mild-to-moderate spinal stenosis. 2. Aortic atherosclerosis. Electronically Signed   By: Sebastian Ache M.D.   On: 03/17/2016 16:10   Dg C-arm 1-60 Min  Result Date: 03/18/2016 CLINICAL DATA:  Posterior lumbar fusion  with L1-5 decompression EXAM: DG C-ARM 61-120 MIN; LUMBAR SPINE - COMPLETE 4+ VIEW FLUOROSCOPY TIME:  52 seconds COMPARISON:  CT lumbar spine dated 03/17/2016 FINDINGS: Intraoperative fluoroscopic spot radiographs during posterior lumbar fixation. Mild superior endplate compression fracture deformity at L3. Pedicle screws at L1 and L2. IMPRESSION: Intraoperative fluoroscopic spot radiographs during posterior lumbar fixation, as above. Electronically Signed   By: Charline Bills M.D.   On: 03/18/2016 14:39   Dg Hips Bilat W Or Wo Pelvis 3-4 Views  Result Date: 03/17/2016 CLINICAL DATA:  Pt states that she fell off of a 5 foot ladder this morning while trying to change the battery in the smoke detector. When she fell backwards she hit her back on the wall and fell to floor landing on her side. Pain in the lower back and pelvis comes and goes and is excruciating. EXAM: DG HIP (WITH OR WITHOUT PELVIS) 3-4V BILAT COMPARISON:  Lumbar spine same day FINDINGS: There is no evidence of hip fracture or dislocation. There is no evidence of arthropathy or other focal bone abnormality. IMPRESSION: Negative. Electronically Signed   By: Norva Pavlov M.D.   On: 03/17/2016 13:34    Osvaldo Shipper, MD  [facility] Pager [phone]  If 7PM-7AM, please contact night-coverage [email or web address] Password [email or web address] 03/21/2016, 10:50 AM   LOS: 3 days

## 2016-03-21 NOTE — Progress Notes (Signed)
Physical Therapy Treatment Patient Details Name: Crystal Trujillo MRN: [id number] DOB: 10/25/1945 Today's Date: 03/21/2016    History of Present Illness 70 y.o. female s/p L1-5 posterior fusion following a fall from a ladder. PMH: osteoporosis.    PT Comments    Pt mobilizing slowly during PT session and requiring heavy encouragement throughout for participation. Lengthy time spent on transfer from chair with emphasis on scooting forward and hand placement. Requiring 2 attempts to achieve standing. Ambulation is also slow and speed is variable with multiple cues needed for safety. Continue to recommend SNF for further rehabilitation following acute stay.   Follow Up Recommendations  SNF;Supervision for mobility/OOB     Equipment Recommendations  Rolling walker with 5" wheels    Recommendations for Other Services       Precautions / Restrictions Precautions Precautions: Back;Fall Required Braces or Orthoses: Spinal Brace Spinal Brace: Thoracolumbosacral orthotic Other Brace/Splint: mobilize in LSO Restrictions Weight Bearing Restrictions: No    Mobility  Bed Mobility               General bed mobility comments: in chair upon arrival  Transfers Overall transfer level: Needs assistance Equipment used: Rolling walker (2 wheeled) Transfers: Sit to/from Stand Sit to Stand: Max assist         General transfer comment: Pt required increased attempts and max cues for technique.  Increased assist due to sedated presentation.  Pt required sternal rubs and noxious stimuli to wake up.  Intermittently falling asleep in standing.   (repeated cues needed for hand placement )  Ambulation/Gait Ambulation/Gait assistance: Min assist Ambulation Distance (Feet): 75 Feet Assistive device: Rolling walker (2 wheeled) Gait Pattern/deviations: Step-through pattern Gait velocity: variable cadence- fast then slow pattern   General Gait Details: assist provided at rw for avoiding objects and  maintaining correct distance from pt    Stairs            Wheelchair Mobility    Modified Rankin (Stroke Patients Only)       Balance Overall balance assessment: Needs assistance Sitting-balance support: Bilateral upper extremity supported Sitting balance-Leahy Scale: Poor     Standing balance support: Bilateral upper extremity supported Standing balance-Leahy Scale: Poor                      Cognition Arousal/Alertness: Lethargic Behavior During Therapy: Flat affect Overall Cognitive Status: Within Functional Limits for tasks assessed                      Exercises      General Comments General comments (skin integrity, edema, etc.): Encouragement needed throughout for pt participation      Pertinent Vitals/Pain Pain Assessment: Faces Faces Pain Scale: Hurts even more Pain Location: back, rt leg Pain Descriptors / Indicators: Grimacing;Moaning Pain Intervention(s): Limited activity within patient's tolerance;Monitored during session    Home Living                      Prior Function            PT Goals (current goals can now be found in the care plan section) Acute Rehab PT Goals Patient Stated Goal: just sit here PT Goal Formulation: With patient Time For Goal Achievement: 04/02/16 Potential to Achieve Goals: Good Progress towards PT goals: Progressing toward goals    Frequency  Min 5X/week    PT Plan Current plan remains appropriate    Co-evaluation  End of Session Equipment Utilized During Treatment: Gait belt;Back brace Activity Tolerance: Patient tolerated treatment well Patient left: in chair;with call bell/phone within reach     Time: 0832-0855 PT Time Calculation (min) (ACUTE ONLY): 23 min  Charges:  $Gait Training: 8-22 mins $Therapeutic Activity: 8-22 mins                    G Codes:      Christiane HaBenjamin J. Deshone Lyssy, PT, CSCS Pager [phone] Office [phone]  03/21/2016, 9:05 AM

## 2016-03-22 LAB — BASIC METABOLIC PANEL
Anion gap: 6 (ref 5–15)
BUN: 10 mg/dL (ref 6–20)
CALCIUM: 8.1 mg/dL — AB (ref 8.9–10.3)
CO2: 29 mmol/L (ref 22–32)
Chloride: 101 mmol/L (ref 101–111)
Creatinine, Ser: 0.56 mg/dL (ref 0.44–1.00)
GFR calc Af Amer: >60 mL/min (ref >60)
GLUCOSE: 107 mg/dL — AB (ref 65–99)
POTASSIUM: 3.7 mmol/L (ref 3.5–5.1)
Sodium: 136 mmol/L (ref 135–145)

## 2016-03-22 LAB — CBC
HEMATOCRIT: 28 % — AB (ref 36.0–46.0)
HEMOGLOBIN: 9.1 g/dL — AB (ref 12.0–15.0)
MCH: 30.4 pg (ref 26.0–34.0)
MCHC: 32.5 g/dL (ref 30.0–36.0)
MCV: 93.6 fL (ref 78.0–100.0)
Platelets: 280 10*3/uL (ref 150–400)
RBC: 2.99 MIL/uL — AB (ref 3.87–5.11)
RDW: 13.1 % (ref 11.5–15.5)
WBC: 8.3 10*3/uL (ref 4.0–10.5)

## 2016-03-22 MED ORDER — HYDROCODONE-ACETAMINOPHEN 5-325 MG PO TABS: 1.0000 | ORAL_TABLET | Freq: Four times a day (QID) | ORAL | 0 refills | Status: AC | PRN

## 2016-03-22 NOTE — Progress Notes (Signed)
Preparing bath, Pt states "you know I think I would rather wait." Tech asked Pt why, and Pt stated she just did not want to go through taking the brace off and putting it back on. Pt expressed that she is rather comfortable and does not want to do any activity at this time.

## 2016-03-22 NOTE — Discharge Summary (Signed)
[facility]  Physician Discharge Summary   Patient ID: Crystal Trujillo MRN: [id number] DOB/AGE: [birth year] 70 y.o.  Admit date: 03/17/2016 Discharge date: 03/22/2016  PCP: Gaspar Garbe, MD  DISCHARGE DIAGNOSES:  Principal Problem:   Closed compression fracture of L3 lumbar vertebra [facility]) Active Problems:   Fall from ladder   Osteoporosis   Hypothyroidism   Hyperlipidemia   Hypokalemia   RECOMMENDATIONS FOR OUTPATIENT FOLLOW UP: 1. Patient to be discharged to skilled nursing facility for short-term rehabilitation 2. CBC and basic metabolic panel to be checked early next week 3. Follow-up with Dr. Venetia Maxon in 3 days  DISCHARGE CONDITION: fair  Diet recommendation: Heart healthy  Filed Weights   03/17/16 1209  Weight: 60.8 kg (134 lb)    INITIAL HISTORY: 70 year old female with a history of osteoporosis, hypothyroidism, hyperlipidemia presented after a mechanical fall at home. The patient was on a ladder changing her smoke detector batteries when she lost her footing and fell onto the ground. There was no loss of consciousness. Because of intense pain, the patient was unable to move and laid on the floor for approximately 2 hours until her neighbors heard her yelling for help. Upon presentation to the emergency department CT of the lumbar spine revealed L3burst fracture with a 60% height loss. Neurosurgery was consulted, and Dr. Rae Roam the patient and placed the patient in TLSO brace initially.The patient was subsequently taken to surgery on 03/18/2016 where she had a lumbar decompression with pedicle screw fixation and posterolateral arthrodesis.  Consultations:  Neurosurgery: Dr. Venetia Maxon  Procedures: Lumbar decompression with pedicle screw fixation and posterolateral arthrodesis  HOSPITAL COURSE:   Compression fracture L3 Patient is status post lumbar decompression with pedicle screw fixation and posterolateral arthrodesis by Dr. Venetia Maxon. Patient was  followed by neurosurgery. She continued to have significant pain issues for about 2 days postoperatively. Her pain medication regimen was adjusted. She was started on muscle relaxants. Pain is improved this morning. She has been seen by physical therapy. The plan is for her to be discharged to a skilled nursing facility for short-term rehabilitation. Stool softeners.  Hypothyroidism Continue Synthroid  Hyperlipidemia Continue statin  Anxiety/depression Continue trazodone and citalopram  Constipation Continue colace and senna  Hypokalemia Potassium has been repleted. Magnesium is 1.8.  Anemia Hemoglobin this morning is noted to be low. This is most likely postoperative, as well as dilutional drop. She hasn't had any overt bleeding. There is no bruising noted in her back. We'll recommend repeating her hemoglobin early next week at the skilled nursing facility.  Overall, stable and improved. Okay for discharge today.  PERTINENT LABS:  The results of significant diagnostics from this hospitalization (including imaging, microbiology, ancillary and laboratory) are listed below for reference.    Microbiology: Recent Results (from the past 240 hour(s))  Surgical pcr screen     Status: None   Collection Time: 03/18/16 10:28 AM  Result Value Ref Range Status   MRSA, PCR NEGATIVE NEGATIVE Final   Staphylococcus aureus NEGATIVE NEGATIVE Final    Comment:        The Xpert SA Assay (FDA approved for NASAL specimens in patients over 41 years of age), is one component of a comprehensive surveillance program.  Test performance has been validated by [facility] for patients greater than or equal to 73 year old. It is not intended to diagnose infection nor to guide or monitor treatment.      Labs: Basic Metabolic Panel:  Recent Labs Lab 03/17/16 2015 03/18/16 0306 03/20/16  [id number] 03/21/16 0620 03/22/16 0313  NA 135 134* 138 134* 136  K 4.1 3.8 3.3* 3.7 3.7  CL 106 103 105  100* 101  CO2 24 24 29 27 29   GLUCOSE 120* 130* 129* 121* 107*  BUN 14 14 <5* 6 10  CREATININE 0.65 0.60 0.52 0.50 0.56  CALCIUM 8.8* 8.5* 7.9* 7.9* 8.1*  MG  --   --  1.8 1.8  --    CBC:  Recent Labs Lab 03/17/16 2015 03/18/16 0306 03/22/16 0313  WBC 9.9 9.1 8.3  NEUTROABS 8.2*  --   --   HGB 13.1 12.3 9.1*  HCT 39.8 39.0 28.0*  MCV 92.8 94.7 93.6  PLT 254 253 280    IMAGING STUDIES Dg Lumbar Spine Complete  Result Date: 03/18/2016 CLINICAL DATA:  Posterior lumbar fusion with L1-5 decompression EXAM: DG C-ARM 61-120 MIN; LUMBAR SPINE - COMPLETE 4+ VIEW FLUOROSCOPY TIME:  52 seconds COMPARISON:  CT lumbar spine dated 03/17/2016 FINDINGS: Intraoperative fluoroscopic spot radiographs during posterior lumbar fixation. Mild superior endplate compression fracture deformity at L3. Pedicle screws at L1 and L2. IMPRESSION: Intraoperative fluoroscopic spot radiographs during posterior lumbar fixation, as above. Electronically Signed   By: Charline BillsSriyesh  Jhase Creppel M.D.   On: 03/18/2016 14:39   Dg Lumbar Spine Complete  Result Date: 03/17/2016 CLINICAL DATA:  Pt states that she fell off of a 5 foot ladder this morning while trying to change the battery in the smoke detector. When she fell backwards she hit her back on the wall and fell to floor landing on her side. Pain in lower back and pelvis. Pain comes and goes and is excruciating. Limited mobility. EXAM: LUMBAR SPINE - COMPLETE 4+ VIEW COMPARISON:  None. FINDINGS: Acute fracture of L3. There is proximally 40% loss of anterior height. No definite retropulsion of fracture fragments. However, detail is limited given the portable nature of the exam. The fractures are identified. No spondylolisthesis. IMPRESSION: Acute fracture of L3. Electronically Signed   By: Norva PavlovElizabeth  Brown M.D.   On: 03/17/2016 13:32   Ct Lumbar Spine Wo Contrast  Result Date: 03/17/2016 CLINICAL DATA:  Fall 15 feet from a ladder. Low back pain. L3 fracture on radiographs. EXAM:  CT LUMBAR SPINE WITHOUT CONTRAST TECHNIQUE: Multidetector CT imaging of the lumbar spine was performed without intravenous contrast administration. Multiplanar CT image reconstructions were also generated. COMPARISON:  Lumbar spine radiographs earlier today FINDINGS: Lumbar segmentation is normal. As seen on earlier radiographs, there is an L3 vertebral body burst fracture with approximately 60% height loss centrally. Posterior vertebral body fragments are retropulsed approximately 6 mm into the canal with resultant mild to moderate spinal stenosis. No posterior element fracture is identified. Vertebral body heights are preserved elsewhere in the lumbar spine without additional fractures identified. Intervertebral disc space heights are preserved. There is no significant listhesis. Multilevel facet arthrosis is noted, moderate at L5-S1. Abdominal aortic atherosclerosis is noted. IMPRESSION: 1. L3 burst fracture with 60% height loss, 6 mm retropulsion, and mild-to-moderate spinal stenosis. 2. Aortic atherosclerosis. Electronically Signed   By: Sebastian AcheAllen  Grady M.D.   On: 03/17/2016 16:10   Dg C-arm 1-60 Min  Result Date: 03/18/2016 CLINICAL DATA:  Posterior lumbar fusion with L1-5 decompression EXAM: DG C-ARM 61-120 MIN; LUMBAR SPINE - COMPLETE 4+ VIEW FLUOROSCOPY TIME:  52 seconds COMPARISON:  CT lumbar spine dated 03/17/2016 FINDINGS: Intraoperative fluoroscopic spot radiographs during posterior lumbar fixation. Mild superior endplate compression fracture deformity at L3. Pedicle screws at L1 and L2. IMPRESSION: Intraoperative fluoroscopic  spot radiographs during posterior lumbar fixation, as above. Electronically Signed   By: Charline Bills M.D.   On: 03/18/2016 14:39   Dg Hips Bilat W Or Wo Pelvis 3-4 Views  Result Date: 03/17/2016 CLINICAL DATA:  Pt states that she fell off of a 5 foot ladder this morning while trying to change the battery in the smoke detector. When she fell backwards she hit her back on  the wall and fell to floor landing on her side. Pain in the lower back and pelvis comes and goes and is excruciating. EXAM: DG HIP (WITH OR WITHOUT PELVIS) 3-4V BILAT COMPARISON:  Lumbar spine same day FINDINGS: There is no evidence of hip fracture or dislocation. There is no evidence of arthropathy or other focal bone abnormality. IMPRESSION: Negative. Electronically Signed   By: Norva Pavlov M.D.   On: 03/17/2016 13:34    DISCHARGE EXAMINATION: Vitals:   03/21/16 1500 03/21/16 2000 03/21/16 2234 03/22/16 0500  BP: (!) 103/43 (!) 99/46 (!) 113/47 (!) 96/46  Pulse: 96 87 79 86  Resp: 16 16 16 16   Temp: 97.3 F (36.3 C) 98 F (36.7 C) 98.1 F (36.7 C) 98.3 F (36.8 C)  TempSrc: Oral Oral Oral Oral  SpO2: 93% 97% 91% 94%  Weight:      Height:       Head: Normocephalic, without obvious abnormality, atraumatic Back: Dressing noted over the midline from her recent surgery. No bruising. No bleeding. Resp: clear to auscultation bilaterally Cardio: regular rate and rhythm, S1, S2 normal, no murmur, click, rub or gallop GI: soft, non-tender; bowel sounds normal; no masses,  no organomegaly Extremities: extremities normal, atraumatic, no cyanosis or edema Neurologic: No focal deficits. Moving all her extremities.  DISPOSITION: SNF  Discharge Instructions    Call MD for:  difficulty breathing, headache or visual disturbances    Complete by:  As directed   Call MD for:  extreme fatigue    Complete by:  As directed   Call MD for:  persistant dizziness or light-headedness    Complete by:  As directed   Call MD for:  persistant nausea and vomiting    Complete by:  As directed   Call MD for:  redness, tenderness, or signs of infection (pain, swelling, redness, odor or green/yellow discharge around incision site)    Complete by:  As directed   Call MD for:  severe uncontrolled pain    Complete by:  As directed   Call MD for:  temperature >100.4    Complete by:  As directed   Discharge  instructions    Complete by:  As directed   CBC and basic metabolic panel to be checked early next week.  You were cared for by a hospitalist during your hospital stay. If you have any questions about your discharge medications or the care you received while you were in the hospital after you are discharged, you can call the unit and asked to speak with the hospitalist on call if the hospitalist that took care of you is not available. Once you are discharged, your primary care physician will handle any further medical issues. Please note that NO REFILLS for any discharge medications will be authorized once you are discharged, as it is imperative that you return to your primary care physician (or establish a relationship with a primary care physician if you do not have one) for your aftercare needs so that they can reassess your need for medications and monitor your lab values.  If you do not have a primary care physician, you can call [phone] for a physician referral.   Increase activity slowly    Complete by:  As directed      ALLERGIES:  Allergies  Allergen Reactions  . Latex Anaphylaxis    From paint, Latex gloves are OK  . Penicillins Anaphylaxis    Brother and sister had anaphylactic reaction, near death.  Put on as a precaution Has patient had a PCN reaction causing immediate rash, facial/tongue/throat swelling, SOB or lightheadedness with hypotension: No Has patient had a PCN reaction causing severe rash involving mucus membranes or skin necrosis: No Has patient had a PCN reaction that required hospitalization No Has patient had a PCN reaction occurring within the last 10 years: No If all of the above answers are "NO", then may proceed with   . Lactose Intolerance (Gi) Nausea And Vomiting    Cultured cheese  . Sulfa Antibiotics Hives    Massive, painful     Current Discharge Medication List    START taking these medications   Details  acetaminophen (TYLENOL) 325 MG tablet Take 2  tablets (650 mg total) by mouth every 6 (six) hours as needed for mild pain (temp > 100.5).    bisacodyl (DULCOLAX) 10 MG suppository Place 1 suppository (10 mg total) rectally daily as needed for moderate constipation. Qty: 12 suppository, Refills: 0    carisoprodol (SOMA) 350 MG tablet Take 1 tablet (350 mg total) by mouth 3 (three) times daily. Qty: 90 tablet, Refills: 0    cyclobenzaprine (FLEXERIL) 5 MG tablet Take 1 tablet (5 mg total) by mouth 2 (two) times daily as needed for muscle spasms. Qty: 20 tablet, Refills: 0    diazepam (VALIUM) 5 MG tablet Take 1 tablet (5 mg total) by mouth every 6 (six) hours as needed for muscle spasms. Qty: 30 tablet, Refills: 0    docusate sodium (COLACE) 100 MG capsule Take 1 capsule (100 mg total) by mouth 2 (two) times daily. Qty: 10 capsule, Refills: 0    HYDROcodone-acetaminophen (NORCO/VICODIN) 5-325 MG tablet Take 1-2 tablets by mouth every 6 (six) hours as needed for moderate pain. Qty: 30 tablet, Refills: 0    tiZANidine (ZANAFLEX) 2 MG tablet Take 1-2 tablets (2-4 mg total) by mouth every 6 (six) hours as needed for muscle spasms. Qty: 30 tablet, Refills: 0      CONTINUE these medications which have NOT CHANGED   Details  alendronate (FOSAMAX) 70 MG tablet Take 70 mg by mouth every Saturday.  Refills: 1    atorvastatin (LIPITOR) 40 MG tablet Take 40 mg by mouth daily at 6 PM.  Refills: 0    citalopram (CELEXA) 20 MG tablet Take 20 mg by mouth daily as needed (for depression).  Refills: 0    levothyroxine (SYNTHROID, LEVOTHROID) 75 MCG tablet Take 75 mcg by mouth every evening.  Refills: 1    naproxen sodium (ANAPROX) 220 MG tablet Take 660 mg by mouth daily at 12 noon.    traZODone (DESYREL) 50 MG tablet Take 50 mg by mouth at bedtime.  Refills: 0       Follow-up Information    STERN,JOSEPH D, MD. Schedule an appointment as soon as possible for a visit in 3 day(s).   Specialty:  Neurosurgery Why:  call next week for  further management Contact information: [address] [place] [address] [phone]           TOTAL DISCHARGE TIME: 14  minutes  [facility]  [facility] Pager [phone]  03/22/2016, 9:45 AM

## 2016-03-22 NOTE — Clinical Social Work Placement (Signed)
   CLINICAL SOCIAL WORK PLACEMENT  NOTE  Date:  03/22/2016  Patient Details  Name: Crystal Trujillo MRN: [id number] Date of Birth: [birth year]  Clinical Social Work is seeking post-discharge placement for this patient at the Skilled  Nursing Facility level of care (*CSW will initial, date and re-position this form in  chart as items are completed):  Yes   Patient/family provided with [place] Clinical Social Work Department's list of facilities offering this level of care within the geographic area requested by the patient (or if unable, by the patient's family).  Yes   Patient/family informed of their freedom to choose among providers that offer the needed level of care, that participate in Medicare, Medicaid or managed care program needed by the patient, have an available bed and are willing to accept the patient.  Yes   Patient/family informed of [facility]'s ownership interest in [facility] and [facility], as well as of the fact that they are under no obligation to receive care at these facilities.  PASRR submitted to EDS on       PASRR number received on       Existing PASRR number confirmed on       FL2 transmitted to all facilities in geographic area requested by pt/family on       FL2 transmitted to all facilities within larger geographic area on       Patient informed that his/her managed care company has contracts with or will negotiate with certain facilities, including the following:        Yes   Patient/family informed of bed offers received.  Patient chooses bed at  [facility]     Physician recommends and patient chooses bed at      Patient to be [facility] to  [facility] on 03/22/16.  Patient to be transferred to facility by  [facility])     Patient family notified on 03/22/16 of transfer.  Name of family member notified:   (Brother and pt notified.)     PHYSICIAN       Additional Comment:     _______________________________________________ Crista CurbWhitaker, [name] 03/22/2016, 11:22 AM

## 2016-03-22 NOTE — Progress Notes (Signed)
Subjective: Patient reports "I only have pain...and it is severe...while I'm getting from a seated position to a standing position. It only lasts ten seconds"  Objective: Vital signs in last 24 hours: Temp:  [97.3 F (36.3 C)-98.4 F (36.9 C)] 98.4 F (36.9 C) (09/07 1441) Pulse Rate:  [79-96] 81 (09/07 1441) Resp:  [16-18] 18 (09/07 1441) BP: (96-113)/(43-53) 106/53 (09/07 1441) SpO2:  [91 %-97 %] 97 % (09/07 1441)  Intake/Output from previous day: 09/06 0701 - 09/07 0700 In: 600 [P.O.:600] Out: -  Intake/Output this shift: Total I/O In: 600 [P.O.:600] Out: 400 [Urine:400]  Alert, conversant. Up in chair, reporting lumbar pain only with position change seated to standing. Incision without erythema, swelling, or drainage beneath honeycomb & Dermabond. TLSO in use. Good strength BLE.   Lab Results:  Recent Labs  03/22/16 0313  WBC 8.3  HGB 9.1*  HCT 28.0*  PLT 280   BMET  Recent Labs  03/21/16 0620 03/22/16 0313  NA 134* 136  K 3.7 3.7  CL 100* 101  CO2 27 29  GLUCOSE 121* 107*  BUN 6 10  CREATININE 0.50 0.56  CALCIUM 7.9* 8.1*    Studies/Results: No results found.  Assessment/Plan: Improving  LOS: 4 days  Planning for transfer to SNF when insurance approves. Follow up in office in 3-4 weeks with DrStern with repeat lumbar films. [facility]. Ok to shower. Continue TLSO when OOB.    Georgiann Cockeroteat, Crystal Trujillo 03/22/2016, 2:54 PM

## 2016-03-22 NOTE — Progress Notes (Signed)
Last bowel movement was on 03/17/16, pt is no schedule stool softener. She doesn't want laxative/enema/suppository. She stated she is comfortable at this time. Patient was encouraged to drink more fluid.

## 2016-03-22 NOTE — Progress Notes (Signed)
[facility] [place] [facility]. Pt is without complain.

## 2016-03-22 NOTE — Discharge Instructions (Signed)
You have an L3 spinal fracture.  Please wear your back brace for support.  Follow up closely with spinal surgeon next week for further care.  Take pain medication as needed.      Lumbar Fracture A lumbar fracture is a break in one of the bones of the lower back. Lumbar fractures range in severity. Severe fractures can damage the spinal cord. CAUSES This condition may be caused by:  A fall (common).  A car accident (common).  A gunshot wound.  A hard, direct hit to the back.  Osteoporosis. SYMPTOMS The main symptom of this condition is severe pain in the lower back. If a fracture is complex or severe, there may also be:  A misshapen or swollen area on the lower back.  A limited ability to move an area of the lower back.  An inability to empty the bladder or bowel.  A loss of strength or sensation in the legs, feet, and toes.  Paralysis. DIAGNOSIS This condition is diagnosed based on:  A physical exam.  Symptoms and what happened just before they developed.  The results of imaging tests, such as an X-ray, CT scan, or MRI. If your nerves have been damaged, you may also have other tests to find out how much damage there is. TREATMENT Treatment for this condition depends on the specifics of the injury. Most fractures can be treated with:  A back brace.  Bed rest and activity restrictions.  Pain medicine.  Physical therapy. Fractures that are complex, involve multiple bones, or make the spine unstable may require surgery to remove pressure from the nerves or spinal cord and to stabilize the broken pieces of bone. During recovery, it is normal to have pain and stiffness in the back for weeks. HOME CARE INSTRUCTIONS Medicines  Take medicines only as directed by your health care provider.  Do not drive or operate heavy machinery while taking pain medicine. Activity  Stay in bed for as long as directed by your health care provider.  If you were shown how to do any  exercises to improve motion and strength in your back, do them as directed by your health care provider.  Return to your normal activities as directed by your health care provider. Ask your health care provider what activities are safe for you. General Instructions  If you were given a neck brace or back brace, wear it as directed by your health care provider.  Keep all follow-up visits as directed by your health care provider. This is important. Failure to follow-up as recommended could result in permanent injury, disability, and long-lasting (chronic) pain. SEEK MEDICAL CARE IF:  Your pain does not improve over time.  You have a persistent cough.  You cannot return to your normal activities as planned or expected. SEEK IMMEDIATE MEDICAL CARE IF:  You have severe pain or your pain suddenly gets worse.  You are unable to move.  You have numbness, tingling, weakness, or paralysis in any part of your body.  You cannot control your bladder or bowel.  You have difficulty breathing.  You have a fever.  You have pain in your chest or abdomen.  You vomit.   This information is not intended to replace advice given to you by your health care provider. Make sure you discuss any questions you have with your health care provider.   Document Released: 10/17/2006 Document Revised: 11/16/2014 Document Reviewed: 06/28/2014 Elsevier Interactive Patient Education [employer]2016 [employer]

## 2016-03-23 ENCOUNTER — Non-Acute Institutional Stay (SKILLED_NURSING_FACILITY): Payer: Medicare Other | Admitting: Adult Health

## 2016-03-23 ENCOUNTER — Encounter: Payer: Self-pay | Admitting: Adult Health

## 2016-03-23 DIAGNOSIS — F329 Major depressive disorder, single episode, unspecified: Secondary | ICD-10-CM | POA: Diagnosis not present

## 2016-03-23 DIAGNOSIS — E034 Atrophy of thyroid (acquired): Secondary | ICD-10-CM

## 2016-03-23 DIAGNOSIS — K5900 Constipation, unspecified: Secondary | ICD-10-CM | POA: Diagnosis not present

## 2016-03-23 DIAGNOSIS — M810 Age-related osteoporosis without current pathological fracture: Secondary | ICD-10-CM

## 2016-03-23 DIAGNOSIS — E785 Hyperlipidemia, unspecified: Secondary | ICD-10-CM

## 2016-03-23 DIAGNOSIS — S32030K Wedge compression fracture of third lumbar vertebra, subsequent encounter for fracture with nonunion: Secondary | ICD-10-CM

## 2016-03-23 DIAGNOSIS — F32A Depression, unspecified: Secondary | ICD-10-CM

## 2016-03-23 DIAGNOSIS — K5909 Other constipation: Secondary | ICD-10-CM

## 2016-03-23 DIAGNOSIS — E038 Other specified hypothyroidism: Secondary | ICD-10-CM

## 2016-03-23 NOTE — Progress Notes (Signed)
Patient ID: Crystal Trujillo, female   DOB: April 29, 1946, 70 y.o.   MRN: [id number]    Location:    [facility] Room Number: 106-A Place of Service:  SNF (31)   CODE STATUS: DNR  Allergies  Allergen Reactions  . Latex Anaphylaxis    From paint, Latex gloves are OK  . Penicillins Anaphylaxis    Brother and sister had anaphylactic reaction, near death.  Put on as a precaution Has patient had a PCN reaction causing immediate rash, facial/tongue/throat swelling, SOB or lightheadedness with hypotension: No Has patient had a PCN reaction causing severe rash involving mucus membranes or skin necrosis: No Has patient had a PCN reaction that required hospitalization No Has patient had a PCN reaction occurring within the last 10 years: No If all of the above answers are "NO", then may proceed with   . Lactose Intolerance (Gi) Nausea And Vomiting    Cultured cheese  . Sulfa Antibiotics Hives    Massive, painful    Chief Complaint  Patient presents with  . Hospitalization Follow-up    Hospital follow up    HPI:  She has been hospitalized following a fall at home from a ladder. She suffered a lumbar fracture was initially treated conservatively but did have a lumbar decompression on 03-18-16. She is here for short term rehab; with her goal to return home. There are no nursing concerns at this time.    Past Medical History:  Diagnosis Date  . Hyperlipidemia   . Hypothyroidism   . Osteoporosis     Past Surgical History:  Procedure Laterality Date  . POSTERIOR LUMBAR FUSION  03/18/2016  . POSTERIOR LUMBAR FUSION 4 LEVEL N/A 03/18/2016   Procedure: POSTERIOR LUMBAR FUSION Lumbar one - Lumbar five WITH DECOMPRESSION Lumbar three;  Surgeon: Maeola Harman, MD;  Location: [facility];  Service: Neurosurgery;  Laterality: N/A;  . WISDOM TOOTH EXTRACTION      Social History   Social History  . Marital status: Unknown    Spouse name: N/A  . Number of children: N/A  . Years of  education: N/A   Occupational History  . Not on file.   Social History Main Topics  . Smoking status: Former Games developer  . Smokeless tobacco: Never Used  . Alcohol use No  . Drug use: No  . Sexual activity: Not on file   Other Topics Concern  . Not on file   Social History Narrative  . No narrative on file   History reviewed. No pertinent family history.    VITAL SIGNS BP 115/77   Pulse (!) 101   Temp 97.4 F (36.3 C) (Oral)   Resp (!) 177   Ht 5\' 1"  (1.549 m)   Wt 134 lb (60.8 kg)   BMI 25.32 kg/m   Patient's Medications  New Prescriptions   No medications on file  Previous Medications   ACETAMINOPHEN (TYLENOL) 325 MG TABLET    Take 2 tablets (650 mg total) by mouth every 6 (six) hours as needed for mild pain (temp > 100.5).   ALENDRONATE (FOSAMAX) 70 MG TABLET    Take 70 mg by mouth every Saturday.    ATORVASTATIN (LIPITOR) 40 MG TABLET    Take 40 mg by mouth daily at 6 PM.    BISACODYL (DULCOLAX) 10 MG SUPPOSITORY    Place 1 suppository (10 mg total) rectally daily as needed for moderate constipation.   CARISOPRODOL (SOMA) 350 MG TABLET    Take 1 tablet (  350 mg total) by mouth 3 (three) times daily.   CITALOPRAM (CELEXA) 20 MG TABLET    Take 20 mg by mouth daily as needed (for depression).    CYCLOBENZAPRINE (FLEXERIL) 5 MG TABLET    Take 1 tablet (5 mg total) by mouth 2 (two) times daily as needed for muscle spasms.   DIAZEPAM (VALIUM) 5 MG TABLET    Take 1 tablet (5 mg total) by mouth every 6 (six) hours as needed for muscle spasms.   DOCUSATE SODIUM (COLACE) 100 MG CAPSULE    Take 1 capsule (100 mg total) by mouth 2 (two) times daily.   HYDROCODONE-ACETAMINOPHEN (NORCO/VICODIN) 5-325 MG TABLET    Take 1-2 tablets by mouth every 6 (six) hours as needed for moderate pain.   LEVOTHYROXINE (SYNTHROID, LEVOTHROID) 75 MCG TABLET    Take 75 mcg by mouth every evening.    NAPROXEN SODIUM (ANAPROX) 220 MG TABLET    Take 660 mg by mouth daily at 12 noon.   TIZANIDINE  (ZANAFLEX) 2 MG TABLET    Take 1-2 tablets (2-4 mg total) by mouth every 6 (six) hours as needed for muscle spasms.   TRAZODONE (DESYREL) 50 MG TABLET    Take 50 mg by mouth at bedtime.   Modified Medications   No medications on file  Discontinued Medications   No medications on file     SIGNIFICANT DIAGNOSTIC EXAMS  03-17-16: ct of lumbar spine: 1. L3 burst fracture with 60% height loss, 6 mm retropulsion, and mild-to-moderate spinal stenosis. 2. Aortic atherosclerosis.     LABS REVIEWED:   03-17-16: wbc 9.9; hgb 13.1; hct 39.8; mcv 92.8; plt 254; glucose 120; bun 14; creat 0.65; k+ 4.1; na++ 135 03-21-16: glucose 121; bun 6; creat 0.50; k+ 3.7; na++ 134; mag 1.8   Review of Systems  Constitutional: Negative for malaise/fatigue.  Respiratory: Negative for cough and shortness of breath.   Cardiovascular: Negative for chest pain, palpitations and leg swelling.  Gastrointestinal: Negative for abdominal pain, constipation and heartburn.  Musculoskeletal: Negative for back pain, joint pain and myalgias.  Skin: Negative.   Neurological: Negative for dizziness.  Psychiatric/Behavioral: The patient is not nervous/anxious.     Physical Exam  Constitutional: She is oriented to person, place, and time. No distress.  Eyes: Conjunctivae are normal.  Neck: Neck supple. No JVD present. No thyromegaly present.  Cardiovascular: Normal rate, regular rhythm and intact distal pulses.   Respiratory: Effort normal and breath sounds normal. No respiratory distress. She has no wheezes.  GI: Soft. Bowel sounds are normal. She exhibits no distension. There is no tenderness.  Musculoskeletal: She exhibits no edema.  Able to move all extremities  Does have back brace   Lymphadenopathy:    She has no cervical adenopathy.  Neurological: She is alert and oriented to person, place, and time.  Skin: Skin is warm and dry. She is not diaphoretic.  Incision line without signs of infection   Psychiatric: She  has a normal mood and affect.     ASSESSMENT/ PLAN:  1. Osteoporosis: will continue fosamax 70 mg weekly   2. Hypothyroidism: will continue synthroid 75 mcg daily   3. Dyslipidemia: will continue lipitor 40 mg daily  4. Constipation: will continue colace twice daily   5. Depression: will continue celexa 20 mg daily and takes trazodone 50 mg nightly   6. L3 compression fracture: is status post lumbar fusion with decompression: will continue therapy as directed; will continue soma 350 mg three times daily  will continue zanaflex 2 or 4 mg every 6 hours as needed; valium 5 mg every 6 hours as needed; aleve daily flexeril 5 mg every 6 hours as needed; and vicodin 5.325 mg 1 or 2 tabs every 6 hours as needed  Will check cbc; cmp   Time spent with patient  50  minutes >50% time spent counseling; reviewing medical record; tests; labs; and developing future plan of care   MD is aware of resident's narcotic use and is in agreement with current plan of care. We will attempt to wean resident as apropriate   Synthia Innocent NP [facility]  Contact [phone] Monday through Friday 8am- 5pm  After hours call [phone]

## 2016-03-27 ENCOUNTER — Encounter: Payer: Self-pay | Admitting: Internal Medicine

## 2016-03-27 ENCOUNTER — Non-Acute Institutional Stay (SKILLED_NURSING_FACILITY): Payer: Medicare Other | Admitting: Internal Medicine

## 2016-03-27 DIAGNOSIS — E785 Hyperlipidemia, unspecified: Secondary | ICD-10-CM | POA: Diagnosis not present

## 2016-03-27 DIAGNOSIS — E034 Atrophy of thyroid (acquired): Secondary | ICD-10-CM | POA: Diagnosis not present

## 2016-03-27 DIAGNOSIS — M810 Age-related osteoporosis without current pathological fracture: Secondary | ICD-10-CM | POA: Diagnosis not present

## 2016-03-27 DIAGNOSIS — E038 Other specified hypothyroidism: Secondary | ICD-10-CM | POA: Diagnosis not present

## 2016-03-27 DIAGNOSIS — Z9889 Other specified postprocedural states: Secondary | ICD-10-CM

## 2016-03-27 DIAGNOSIS — S32030K Wedge compression fracture of third lumbar vertebra, subsequent encounter for fracture with nonunion: Secondary | ICD-10-CM | POA: Diagnosis not present

## 2016-03-27 DIAGNOSIS — D62 Acute posthemorrhagic anemia: Secondary | ICD-10-CM | POA: Diagnosis not present

## 2016-03-27 NOTE — Progress Notes (Signed)
Patient ID: Crystal Trujillo, female   DOB: 26-Feb-1946, 70 y.o.   MRN: [id number]    HISTORY AND PHYSICAL   DATE:   03/27/2016  Location:     [facility] Room Number: 106 A Place of Service: SNF (31)   Extended Emergency Contact Information Primary Emergency Contact: Warnke,Angie Address: [address]          [place], [place] [address] [place] Phone: [phone] Mobile Phone: [phone] Relation: Other Secondary Emergency Contact: Atwell,Chris  [place] Mobile Phone: [phone] Relation: None  Advanced Directive information Does patient have an advance directive?: No, Would patient like information on creating an advanced directive?: No - patient declined information  Chief Complaint  Patient presents with  . New Admit To SNF    HPI:  70 yo female seen today as a new admission into SNF following hospital stay for L3 closed compression fx, s/p fall from ladder, osteoporosis, hypothyroidism, hyperlipidemia and hypokalemia. She presented to the ED after fall from ladder while changing batteries in smoke detector. No LOC. She had CT L spine that revealed L3 burst fx with 60% height loss. She was evaluated by neurosx Dr Vertell Limber and tx with TLSO brace initially. She underwent lumbar decompression with pedicle screw fixation and posterolateral arthrodesis on 9/3rd. Hgb 13.1->9.1; K+ as low as 3.3 -->3.7 at d/c. No PRBCs given. Pain meds adjusted. She presents to SNF for short term rehab  She has no concerns. Pain controlled on BID percocet and prn naproxen. No nursing issues. No falls. Appetite excellent. Sleeps well most nights. She reports her BP nmly runs a little low (SBP low 100s). She sees here PCP yearly and gets annual labs drawn in April.  Hypothyroidism - takes levothyroxine  Hyperlipidemia - stable on lipitor. LDL 35; HDL 26  Osteoporosis - takes fosamax weekly. DXA UTD. She gets one q2 yrs and believes she had one in 2016.      Depression/anxiety - mood stable on citalopram and trazodone  Constipation - on bowel regimen of dulcolax and colace  Past Medical History:  Diagnosis Date  . Hyperlipidemia   . Hypothyroidism   . Osteoporosis     Past Surgical History:  Procedure Laterality Date  . POSTERIOR LUMBAR FUSION  03/18/2016  . POSTERIOR LUMBAR FUSION 4 LEVEL N/A 03/18/2016   Procedure: POSTERIOR LUMBAR FUSION Lumbar one - Lumbar five WITH DECOMPRESSION Lumbar three;  Surgeon: Erline Levine, MD;  Location: [facility];  Service: Neurosurgery;  Laterality: N/A;  . [place] EXTRACTION      Patient Care Team: Haywood Pao, MD as PCP - General (Internal Medicine)  Social History   Social History  . Marital status: Unknown    Spouse name: N/A  . Number of children: N/A  . Years of education: N/A   Occupational History  . Not on file.   Social History Main Topics  . Smoking status: Former Research scientist (life sciences)  . Smokeless tobacco: Never Used  . Alcohol use No  . Drug use: No  . Sexual activity: Not on file   Other Topics Concern  . Not on file   Social History Narrative  . No narrative on file     reports that she has quit smoking. She has never used smokeless tobacco. She reports that she does not drink alcohol or use drugs.  History reviewed. No pertinent family history. No family status information on file.    Immunization History  Administered Date(s) Administered  .  PPD Test 03/22/2016  . Pneumococcal Conjugate-13 10/18/2015    Allergies  Allergen Reactions  . Latex Anaphylaxis    From paint, Latex gloves are OK  . Penicillins Anaphylaxis    Brother and sister had anaphylactic reaction, near death.  Put on as a precaution Has patient had a PCN reaction causing immediate rash, facial/tongue/throat swelling, SOB or lightheadedness with hypotension: No Has patient had a PCN reaction causing severe rash involving mucus membranes or skin necrosis: No Has patient had a PCN reaction  that required hospitalization No Has patient had a PCN reaction occurring within the last 10 years: No If all of the above answers are "NO", then may proceed with   . Lactose Intolerance (Gi) Nausea And Vomiting    Cultured cheese  . Sulfa Antibiotics Hives    Massive, painful    Medications: Patient's Medications  New Prescriptions   No medications on file  Previous Medications   ACETAMINOPHEN (TYLENOL) 325 MG TABLET    Take 2 tablets (650 mg total) by mouth every 6 (six) hours as needed for mild pain (temp > 100.5).   ALENDRONATE (FOSAMAX) 70 MG TABLET    Take 70 mg by mouth every Saturday.    ATORVASTATIN (LIPITOR) 40 MG TABLET    Take 40 mg by mouth daily at 6 PM.    BISACODYL (DULCOLAX) 10 MG SUPPOSITORY    Place 1 suppository (10 mg total) rectally daily as needed for moderate constipation.   CARISOPRODOL (SOMA) 350 MG TABLET    Take 1 tablet (350 mg total) by mouth 3 (three) times daily.   CITALOPRAM (CELEXA) 20 MG TABLET    Take 20 mg by mouth daily as needed (for depression).    CYCLOBENZAPRINE (FLEXERIL) 5 MG TABLET    Take 1 tablet (5 mg total) by mouth 2 (two) times daily as needed for muscle spasms.   DIAZEPAM (VALIUM) 5 MG TABLET    Take 1 tablet (5 mg total) by mouth every 6 (six) hours as needed for muscle spasms.   DOCUSATE SODIUM (COLACE) 100 MG CAPSULE    Take 1 capsule (100 mg total) by mouth 2 (two) times daily.   HYDROCODONE-ACETAMINOPHEN (NORCO/VICODIN) 5-325 MG TABLET    Take 1-2 tablets by mouth every 6 (six) hours as needed for moderate pain.   LEVOTHYROXINE (SYNTHROID, LEVOTHROID) 75 MCG TABLET    Take 75 mcg by mouth every evening.    NAPROXEN SODIUM (ANAPROX) 220 MG TABLET    Take 660 mg by mouth daily at 12 noon.   TIZANIDINE (ZANAFLEX) 2 MG TABLET    Take 1-2 tablets (2-4 mg total) by mouth every 6 (six) hours as needed for muscle spasms.   TRAZODONE (DESYREL) 50 MG TABLET    Take 50 mg by mouth at bedtime.   Modified Medications   No medications on file    Discontinued Medications   No medications on file    Review of Systems  Musculoskeletal: Positive for back pain and gait problem.  All other systems reviewed and are negative.   Vitals:   03/27/16 0920  BP: (!) 80/54  Pulse: 70  Resp: 16  Temp: 98.1 F (36.7 C)  TempSrc: Oral   There is no height or weight on file to calculate BMI.  Physical Exam  Constitutional: She is oriented to person, place, and time. She appears well-developed and well-nourished.  Looks well in NAD, sitting in chair at bedside  HENT:  Mouth/Throat: Oropharynx is clear and moist. No oropharyngeal  exudate.  MMM  Eyes: Pupils are equal, round, and reactive to light. No scleral icterus.  Neck: Neck supple. Carotid bruit is not present. No tracheal deviation present. No thyromegaly present.  Cardiovascular: Normal rate, regular rhythm and intact distal pulses.  Exam reveals no gallop and no friction rub.   Murmur (1/6 SEM) heard. No LE edema b/l. no calf TTP.   Pulmonary/Chest: Effort normal and breath sounds normal. No stridor. No respiratory distress. She has no wheezes. She has no rales.  Abdominal: There is no hepatomegaly.  Exam limited due to back brace. BS present, NT, ND  Musculoskeletal:  Back brace intact  Lymphadenopathy:    She has no cervical adenopathy.  Neurological: She is alert and oriented to person, place, and time. She has normal reflexes.  Skin: Skin is warm and dry. No rash noted.  Psychiatric: She has a normal mood and affect. Her behavior is normal. Judgment and thought content normal.     Labs reviewed: Admission on 03/17/2016, Discharged on 03/22/2016  Component Date Value Ref Range Status  . Sodium 03/17/2016 135  135 - 145 mmol/L Final  . Potassium 03/17/2016 4.1  3.5 - 5.1 mmol/L Final  . Chloride 03/17/2016 106  101 - 111 mmol/L Final  . CO2 03/17/2016 24  22 - 32 mmol/L Final  . Glucose, Bld 03/17/2016 120* 65 - 99 mg/dL Final  . BUN 03/17/2016 14  6 - 20 mg/dL  Final  . Creatinine, Ser 03/17/2016 0.65  0.44 - 1.00 mg/dL Final  . Calcium 03/17/2016 8.8* 8.9 - 10.3 mg/dL Final  . GFR calc non Af Amer 03/17/2016 >60  >60 mL/min Final  . GFR calc Af Amer 03/17/2016 >60  >60 mL/min Final   Comment: (NOTE) The eGFR has been calculated using the CKD EPI equation. This calculation has not been validated in all clinical situations. eGFR's persistently <60 mL/min signify possible Chronic Kidney Disease.   . Anion gap 03/17/2016 5  5 - 15 Final  . WBC 03/17/2016 9.9  4.0 - 10.5 K/uL Final  . RBC 03/17/2016 4.29  3.87 - 5.11 MIL/uL Final  . Hemoglobin 03/17/2016 13.1  12.0 - 15.0 g/dL Final  . HCT 03/17/2016 39.8  36.0 - 46.0 % Final  . MCV 03/17/2016 92.8  78.0 - 100.0 fL Final  . MCH 03/17/2016 30.5  26.0 - 34.0 pg Final  . MCHC 03/17/2016 32.9  30.0 - 36.0 g/dL Final  . RDW 03/17/2016 13.2  11.5 - 15.5 % Final  . Platelets 03/17/2016 254  150 - 400 K/uL Final  . Neutrophils Relative % 03/17/2016 83  % Final  . Neutro Abs 03/17/2016 8.2* 1.7 - 7.7 K/uL Final  . Lymphocytes Relative 03/17/2016 11  % Final  . Lymphs Abs 03/17/2016 1.0  0.7 - 4.0 K/uL Final  . Monocytes Relative 03/17/2016 6  % Final  . Monocytes Absolute 03/17/2016 0.6  0.1 - 1.0 K/uL Final  . Eosinophils Relative 03/17/2016 0  % Final  . Eosinophils Absolute 03/17/2016 0.0  0.0 - 0.7 K/uL Final  . Basophils Relative 03/17/2016 0  % Final  . Basophils Absolute 03/17/2016 0.0  0.0 - 0.1 K/uL Final  . Sodium 03/18/2016 134* 135 - 145 mmol/L Final  . Potassium 03/18/2016 3.8  3.5 - 5.1 mmol/L Final  . Chloride 03/18/2016 103  101 - 111 mmol/L Final  . CO2 03/18/2016 24  22 - 32 mmol/L Final  . Glucose, Bld 03/18/2016 130* 65 - 99 mg/dL Final  .  BUN 03/18/2016 14  6 - 20 mg/dL Final  . Creatinine, Ser 03/18/2016 0.60  0.44 - 1.00 mg/dL Final  . Calcium 03/18/2016 8.5* 8.9 - 10.3 mg/dL Final  . GFR calc non Af Amer 03/18/2016 >60  >60 mL/min Final  . GFR calc Af Amer 03/18/2016 >60   >60 mL/min Final   Comment: (NOTE) The eGFR has been calculated using the CKD EPI equation. This calculation has not been validated in all clinical situations. eGFR's persistently <60 mL/min signify possible Chronic Kidney Disease.   . Anion gap 03/18/2016 7  5 - 15 Final  . WBC 03/18/2016 9.1  4.0 - 10.5 K/uL Final  . RBC 03/18/2016 4.12  3.87 - 5.11 MIL/uL Final  . Hemoglobin 03/18/2016 12.3  12.0 - 15.0 g/dL Final  . HCT 03/18/2016 39.0  36.0 - 46.0 % Final  . MCV 03/18/2016 94.7  78.0 - 100.0 fL Final  . MCH 03/18/2016 29.9  26.0 - 34.0 pg Final  . MCHC 03/18/2016 31.5  30.0 - 36.0 g/dL Final  . RDW 03/18/2016 13.4  11.5 - 15.5 % Final  . Platelets 03/18/2016 253  150 - 400 K/uL Final  . MRSA, PCR 03/18/2016 NEGATIVE  NEGATIVE Final  . Staphylococcus aureus 03/18/2016 NEGATIVE  NEGATIVE Final   Comment:        The Xpert SA Assay (FDA approved for NASAL specimens in patients over 32 years of age), is one component of a comprehensive surveillance program.  Test performance has been validated by [facility] for patients greater than or equal to 60 year old. It is not intended to diagnose infection nor to guide or monitor treatment.   . Sodium 03/20/2016 138  135 - 145 mmol/L Final  . Potassium 03/20/2016 3.3* 3.5 - 5.1 mmol/L Final  . Chloride 03/20/2016 105  101 - 111 mmol/L Final  . CO2 03/20/2016 29  22 - 32 mmol/L Final  . Glucose, Bld 03/20/2016 129* 65 - 99 mg/dL Final  . BUN 03/20/2016 <5* 6 - 20 mg/dL Final  . Creatinine, Ser 03/20/2016 0.52  0.44 - 1.00 mg/dL Final  . Calcium 03/20/2016 7.9* 8.9 - 10.3 mg/dL Final  . GFR calc non Af Amer 03/20/2016 >60  >60 mL/min Final  . GFR calc Af Amer 03/20/2016 >60  >60 mL/min Final   Comment: (NOTE) The eGFR has been calculated using the CKD EPI equation. This calculation has not been validated in all clinical situations. eGFR's persistently <60 mL/min signify possible Chronic Kidney Disease.   . Anion gap 03/20/2016  4* 5 - 15 Final  . Magnesium 03/20/2016 1.8  1.7 - 2.4 mg/dL Final  . Sodium 03/21/2016 134* 135 - 145 mmol/L Final  . Potassium 03/21/2016 3.7  3.5 - 5.1 mmol/L Final  . Chloride 03/21/2016 100* 101 - 111 mmol/L Final  . CO2 03/21/2016 27  22 - 32 mmol/L Final  . Glucose, Bld 03/21/2016 121* 65 - 99 mg/dL Final  . BUN 03/21/2016 6  6 - 20 mg/dL Final  . Creatinine, Ser 03/21/2016 0.50  0.44 - 1.00 mg/dL Final  . Calcium 03/21/2016 7.9* 8.9 - 10.3 mg/dL Final  . GFR calc non Af Amer 03/21/2016 >60  >60 mL/min Final  . GFR calc Af Amer 03/21/2016 >60  >60 mL/min Final   Comment: (NOTE) The eGFR has been calculated using the CKD EPI equation. This calculation has not been validated in all clinical situations. eGFR's persistently <60 mL/min signify possible Chronic Kidney Disease.   Marland Kitchen  Anion gap 03/21/2016 7  5 - 15 Final  . Magnesium 03/21/2016 1.8  1.7 - 2.4 mg/dL Final  . WBC 03/22/2016 8.3  4.0 - 10.5 K/uL Final  . RBC 03/22/2016 2.99* 3.87 - 5.11 MIL/uL Final  . Hemoglobin 03/22/2016 9.1* 12.0 - 15.0 g/dL Final  . HCT 03/22/2016 28.0* 36.0 - 46.0 % Final  . MCV 03/22/2016 93.6  78.0 - 100.0 fL Final  . MCH 03/22/2016 30.4  26.0 - 34.0 pg Final  . MCHC 03/22/2016 32.5  30.0 - 36.0 g/dL Final  . RDW 03/22/2016 13.1  11.5 - 15.5 % Final  . Platelets 03/22/2016 280  150 - 400 K/uL Final  . Sodium 03/22/2016 136  135 - 145 mmol/L Final  . Potassium 03/22/2016 3.7  3.5 - 5.1 mmol/L Final  . Chloride 03/22/2016 101  101 - 111 mmol/L Final  . CO2 03/22/2016 29  22 - 32 mmol/L Final  . Glucose, Bld 03/22/2016 107* 65 - 99 mg/dL Final  . BUN 03/22/2016 10  6 - 20 mg/dL Final  . Creatinine, Ser 03/22/2016 0.56  0.44 - 1.00 mg/dL Final  . Calcium 03/22/2016 8.1* 8.9 - 10.3 mg/dL Final  . GFR calc non Af Amer 03/22/2016 >60  >60 mL/min Final  . GFR calc Af Amer 03/22/2016 >60  >60 mL/min Final   Comment: (NOTE) The eGFR has been calculated using the CKD EPI equation. This calculation has  not been validated in all clinical situations. eGFR's persistently <60 mL/min signify possible Chronic Kidney Disease.   . Anion gap 03/22/2016 6  5 - 15 Final    Dg Lumbar Spine Complete  Result Date: 03/18/2016 CLINICAL DATA:  Posterior lumbar fusion with L1-5 decompression EXAM: DG C-ARM 61-120 MIN; LUMBAR SPINE - COMPLETE 4+ VIEW FLUOROSCOPY TIME:  52 seconds COMPARISON:  CT lumbar spine dated 03/17/2016 FINDINGS: Intraoperative fluoroscopic spot radiographs during posterior lumbar fixation. Mild superior endplate compression fracture deformity at L3. Pedicle screws at L1 and L2. IMPRESSION: Intraoperative fluoroscopic spot radiographs during posterior lumbar fixation, as above. Electronically Signed   By: Julian Hy M.D.   On: 03/18/2016 14:39   Dg Lumbar Spine Complete  Result Date: 03/17/2016 CLINICAL DATA:  Pt states that she fell off of a 5 foot ladder this morning while trying to change the battery in the smoke detector. When she fell backwards she hit her back on the wall and fell to floor landing on her side. Pain in lower back and pelvis. Pain comes and goes and is excruciating. Limited mobility. EXAM: LUMBAR SPINE - COMPLETE 4+ VIEW COMPARISON:  None. FINDINGS: Acute fracture of L3. There is proximally 40% loss of anterior height. No definite retropulsion of fracture fragments. However, detail is limited given the portable nature of the exam. The fractures are identified. No spondylolisthesis. IMPRESSION: Acute fracture of L3. Electronically Signed   By: Nolon Nations M.D.   On: 03/17/2016 13:32   Ct Lumbar Spine Wo Contrast  Result Date: 03/17/2016 CLINICAL DATA:  Fall 15 feet from a ladder. Low back pain. L3 fracture on radiographs. EXAM: CT LUMBAR SPINE WITHOUT CONTRAST TECHNIQUE: Multidetector CT imaging of the lumbar spine was performed without intravenous contrast administration. Multiplanar CT image reconstructions were also generated. COMPARISON:  Lumbar spine  radiographs earlier today FINDINGS: Lumbar segmentation is normal. As seen on earlier radiographs, there is an L3 vertebral body burst fracture with approximately 60% height loss centrally. Posterior vertebral body fragments are retropulsed approximately 6 mm into the canal with  resultant mild to moderate spinal stenosis. No posterior element fracture is identified. Vertebral body heights are preserved elsewhere in the lumbar spine without additional fractures identified. Intervertebral disc space heights are preserved. There is no significant listhesis. Multilevel facet arthrosis is noted, moderate at L5-S1. Abdominal aortic atherosclerosis is noted. IMPRESSION: 1. L3 burst fracture with 60% height loss, 6 mm retropulsion, and mild-to-moderate spinal stenosis. 2. Aortic atherosclerosis. Electronically Signed   By: Logan Bores M.D.   On: 03/17/2016 16:10   Dg C-arm 1-60 Min  Result Date: 03/18/2016 CLINICAL DATA:  Posterior lumbar fusion with L1-5 decompression EXAM: DG C-ARM 61-120 MIN; LUMBAR SPINE - COMPLETE 4+ VIEW FLUOROSCOPY TIME:  52 seconds COMPARISON:  CT lumbar spine dated 03/17/2016 FINDINGS: Intraoperative fluoroscopic spot radiographs during posterior lumbar fixation. Mild superior endplate compression fracture deformity at L3. Pedicle screws at L1 and L2. IMPRESSION: Intraoperative fluoroscopic spot radiographs during posterior lumbar fixation, as above. Electronically Signed   By: Julian Hy M.D.   On: 03/18/2016 14:39   Dg Hips Bilat W Or Wo Pelvis 3-4 Views  Result Date: 03/17/2016 CLINICAL DATA:  Pt states that she fell off of a 5 foot ladder this morning while trying to change the battery in the smoke detector. When she fell backwards she hit her back on the wall and fell to floor landing on her side. Pain in the lower back and pelvis comes and goes and is excruciating. EXAM: DG HIP (WITH OR WITHOUT PELVIS) 3-4V BILAT COMPARISON:  Lumbar spine same day FINDINGS: There is no evidence  of hip fracture or dislocation. There is no evidence of arthropathy or other focal bone abnormality. IMPRESSION: Negative. Electronically Signed   By: Nolon Nations M.D.   On: 03/17/2016 13:34     Assessment/Plan   ICD-9-CM ICD-10-CM   1. History of back surgery V45.89 Z98.890    Lumbar decompression with pedicle screw fixation and posterolateral arthrodesis on 03/18/16 due to L3 compression fx s/p fall from ladder  2. Postoperative anemia due to acute blood loss 285.1 D62   3. Osteoporosis 733.00 M81.0   4. Closed compression fracture of L3 lumbar vertebra, with nonunion, subsequent encounter 733.82 S32.030K   5. Hyperlipidemia 272.4 E78.5   6. Hypothyroidism due to acquired atrophy of thyroid 244.8 E03.8    246.8 E03.4     Cont current meds as ordered  PT/OT/ST as ordered  Repeat CBC and BMP; check TSH and Vit D 25 OH level  F/u with Dr Vertell Limber as scheduled  Check BP with manual cuff daily  GOAL: short term rehab and d/c home when medically appropriate. Communicated with pt and nursing.  Will follow  Raiden Yearwood S. Perlie Gold  [facility] [address] [place], [place] [address] [phone] Cell (Monday-Friday 8 AM - 5 PM) [phone] After 5 PM and follow prompts

## 2016-04-05 ENCOUNTER — Encounter: Payer: Self-pay | Admitting: Internal Medicine

## 2016-04-05 ENCOUNTER — Non-Acute Institutional Stay (SKILLED_NURSING_FACILITY): Payer: Medicare Other | Admitting: Internal Medicine

## 2016-04-05 DIAGNOSIS — E782 Mixed hyperlipidemia: Secondary | ICD-10-CM | POA: Diagnosis not present

## 2016-04-05 DIAGNOSIS — F322 Major depressive disorder, single episode, severe without psychotic features: Secondary | ICD-10-CM

## 2016-04-05 DIAGNOSIS — E034 Atrophy of thyroid (acquired): Secondary | ICD-10-CM

## 2016-04-05 DIAGNOSIS — S32030K Wedge compression fracture of third lumbar vertebra, subsequent encounter for fracture with nonunion: Secondary | ICD-10-CM | POA: Diagnosis not present

## 2016-04-05 DIAGNOSIS — K5909 Other constipation: Secondary | ICD-10-CM | POA: Diagnosis not present

## 2016-04-05 DIAGNOSIS — Z9889 Other specified postprocedural states: Secondary | ICD-10-CM | POA: Diagnosis not present

## 2016-04-05 DIAGNOSIS — D62 Acute posthemorrhagic anemia: Secondary | ICD-10-CM | POA: Diagnosis not present

## 2016-04-05 DIAGNOSIS — M810 Age-related osteoporosis without current pathological fracture: Secondary | ICD-10-CM | POA: Diagnosis not present

## 2016-04-05 NOTE — Progress Notes (Signed)
Patient ID: Crystal Trujillo, female   DOB: Nov 21, 1945, 70 y.o.   MRN: [id number]    DATE:  04/05/2016  Location:    [facility] Room Number: 106 A Place of Service: SNF (31)   Extended Emergency Contact Information Primary Emergency Contact: Vitali,Angie Address: [address]          [place], [place] [address] [place] Phone: [phone] Mobile Phone: [phone] Relation: Other Secondary Emergency Contact: Atwell,Chris  [place] Mobile Phone: [phone] Relation: None  Advanced Directive information Does patient have an advance directive?: No, Would patient like information on creating an advanced directive?: No - patient declined information  Chief Complaint  Patient presents with  . Discharge Note    HPI:  70 yo female seen today for d/c from SNF following short term rehab. She will going home with [facility] nursing and DME rolling walker, 3-in-1 commode. She reports feeling well overall. No pain. Sh ehas been refusing norco. She states her muscles "feel sore". No nursing issues. No falls.   BRIEF SUMMARY: She presented to the ED and was admitted after fall from ladder while changing batteries in smoke detector. No LOC. She had CT L spine that revealed L3 burst fx with 60% height loss. She was evaluated by neurosx Dr Vertell Limber and tx with TLSO brace initially. She underwent lumbar decompression with pedicle screw fixation and posterolateral arthrodesis on 9/3rd. Hgb 13.1->9.1; K+ as low as 3.3 -->3.7 at d/c. No PRBCs given. Pain meds adjusted. She presented to SNF for short term rehab.  Hypothyroidism - takes levothyroxine  Hyperlipidemia - stable on lipitor. LDL 35; HDL 26  Osteoporosis - takes fosamax weekly. DXA UTD. She gets one q2 yrs and believes she had one in 2016.    Depression/anxiety - mood stable on citalopram and trazodone  Constipation - on bowel regimen of dulcolax and colace   Past Medical History:  Diagnosis Date    . Hyperlipidemia   . Hypothyroidism   . Osteoporosis     Past Surgical History:  Procedure Laterality Date  . POSTERIOR LUMBAR FUSION  03/18/2016  . POSTERIOR LUMBAR FUSION 4 LEVEL N/A 03/18/2016   Procedure: POSTERIOR LUMBAR FUSION Lumbar one - Lumbar five WITH DECOMPRESSION Lumbar three;  Surgeon: Erline Levine, MD;  Location: [facility];  Service: Neurosurgery;  Laterality: N/A;  . [place] EXTRACTION      Patient Care Team: Haywood Pao, MD as PCP - General (Internal Medicine)  Social History   Social History  . Marital status: Unknown    Spouse name: N/A  . Number of children: N/A  . Years of education: N/A   Occupational History  . Not on file.   Social History Main Topics  . Smoking status: Former Research scientist (life sciences)  . Smokeless tobacco: Never Used  . Alcohol use No  . Drug use: No  . Sexual activity: Not on file   Other Topics Concern  . Not on file   Social History Narrative  . No narrative on file     reports that she has quit smoking. She has never used smokeless tobacco. She reports that she does not drink alcohol or use drugs.  History reviewed. No pertinent family history. No family status information on file.    Immunization History  Administered Date(s) Administered  . PPD Test 03/22/2016  . Pneumococcal Conjugate-13 10/18/2015    Allergies  Allergen Reactions  . Latex Anaphylaxis    From paint, Latex gloves are  OK  . Penicillins Anaphylaxis    Brother and sister had anaphylactic reaction, near death.  Put on as a precaution Has patient had a PCN reaction causing immediate rash, facial/tongue/throat swelling, SOB or lightheadedness with hypotension: No Has patient had a PCN reaction causing severe rash involving mucus membranes or skin necrosis: No Has patient had a PCN reaction that required hospitalization No Has patient had a PCN reaction occurring within the last 10 years: No If all of the above answers are "NO", then may proceed with    . Lactose Intolerance (Gi) Nausea And Vomiting    Cultured cheese  . Sulfa Antibiotics Hives    Massive, painful    Medications: Patient's Medications  New Prescriptions   No medications on file  Previous Medications   ACETAMINOPHEN (TYLENOL) 325 MG TABLET    Take 2 tablets (650 mg total) by mouth every 6 (six) hours as needed for mild pain (temp > 100.5).   ALENDRONATE (FOSAMAX) 70 MG TABLET    Take 70 mg by mouth every Saturday.    ATORVASTATIN (LIPITOR) 40 MG TABLET    Take 40 mg by mouth daily at 6 PM.    BISACODYL (DULCOLAX) 10 MG SUPPOSITORY    Place 1 suppository (10 mg total) rectally daily as needed for moderate constipation.   CARISOPRODOL (SOMA) 350 MG TABLET    Take 1 tablet (350 mg total) by mouth 3 (three) times daily.   CITALOPRAM (CELEXA) 20 MG TABLET    Take 20 mg by mouth daily as needed (for depression).    CYCLOBENZAPRINE (FLEXERIL) 5 MG TABLET    Take 1 tablet (5 mg total) by mouth 2 (two) times daily as needed for muscle spasms.   DIAZEPAM (VALIUM) 5 MG TABLET    Take 1 tablet (5 mg total) by mouth every 6 (six) hours as needed for muscle spasms.   DOCUSATE SODIUM (COLACE) 100 MG CAPSULE    Take 1 capsule (100 mg total) by mouth 2 (two) times daily.   HYDROCODONE-ACETAMINOPHEN (NORCO/VICODIN) 5-325 MG TABLET    Take 1-2 tablets by mouth every 6 (six) hours as needed for moderate pain.   LEVOTHYROXINE (SYNTHROID, LEVOTHROID) 75 MCG TABLET    Take 75 mcg by mouth every evening.    NAPROXEN SODIUM (ANAPROX) 220 MG TABLET    Take 660 mg by mouth daily at 12 noon.   TIZANIDINE (ZANAFLEX) 2 MG TABLET    Take 1-2 tablets (2-4 mg total) by mouth every 6 (six) hours as needed for muscle spasms.   TRAZODONE (DESYREL) 50 MG TABLET    Take 50 mg by mouth at bedtime.   Modified Medications   No medications on file  Discontinued Medications   No medications on file    Review of Systems  Musculoskeletal: Positive for gait problem.  All other systems reviewed and are  negative.   Vitals:   04/05/16 0919  BP: (!) 114/50  Pulse: 82  Resp: 18  Temp: 97.7 F (36.5 C)  TempSrc: Oral  SpO2: 95%  Weight: 120 lb (54.4 kg)  Height: 5' 1" (1.549 m)   Body mass index is 22.67 kg/m.  Physical Exam  Constitutional: She is oriented to person, place, and time. She appears well-developed and well-nourished. No distress.  Musculoskeletal: She exhibits edema.  Neurological: She is alert and oriented to person, place, and time.  Skin: Skin is warm and dry. No rash noted.  Psychiatric: She has a normal mood and affect. Her behavior is normal.  Judgment and thought content normal.     Labs reviewed: Admission on 03/17/2016, Discharged on 03/22/2016  Component Date Value Ref Range Status  . Sodium 03/17/2016 135  135 - 145 mmol/L Final  . Potassium 03/17/2016 4.1  3.5 - 5.1 mmol/L Final  . Chloride 03/17/2016 106  101 - 111 mmol/L Final  . CO2 03/17/2016 24  22 - 32 mmol/L Final  . Glucose, Bld 03/17/2016 120* 65 - 99 mg/dL Final  . BUN 03/17/2016 14  6 - 20 mg/dL Final  . Creatinine, Ser 03/17/2016 0.65  0.44 - 1.00 mg/dL Final  . Calcium 03/17/2016 8.8* 8.9 - 10.3 mg/dL Final  . GFR calc non Af Amer 03/17/2016 >60  >60 mL/min Final  . GFR calc Af Amer 03/17/2016 >60  >60 mL/min Final   Comment: (NOTE) The eGFR has been calculated using the CKD EPI equation. This calculation has not been validated in all clinical situations. eGFR's persistently <60 mL/min signify possible Chronic Kidney Disease.   . Anion gap 03/17/2016 5  5 - 15 Final  . WBC 03/17/2016 9.9  4.0 - 10.5 K/uL Final  . RBC 03/17/2016 4.29  3.87 - 5.11 MIL/uL Final  . Hemoglobin 03/17/2016 13.1  12.0 - 15.0 g/dL Final  . HCT 03/17/2016 39.8  36.0 - 46.0 % Final  . MCV 03/17/2016 92.8  78.0 - 100.0 fL Final  . MCH 03/17/2016 30.5  26.0 - 34.0 pg Final  . MCHC 03/17/2016 32.9  30.0 - 36.0 g/dL Final  . RDW 03/17/2016 13.2  11.5 - 15.5 % Final  . Platelets 03/17/2016 254  150 - 400 K/uL  Final  . Neutrophils Relative % 03/17/2016 83  % Final  . Neutro Abs 03/17/2016 8.2* 1.7 - 7.7 K/uL Final  . Lymphocytes Relative 03/17/2016 11  % Final  . Lymphs Abs 03/17/2016 1.0  0.7 - 4.0 K/uL Final  . Monocytes Relative 03/17/2016 6  % Final  . Monocytes Absolute 03/17/2016 0.6  0.1 - 1.0 K/uL Final  . Eosinophils Relative 03/17/2016 0  % Final  . Eosinophils Absolute 03/17/2016 0.0  0.0 - 0.7 K/uL Final  . Basophils Relative 03/17/2016 0  % Final  . Basophils Absolute 03/17/2016 0.0  0.0 - 0.1 K/uL Final  . Sodium 03/18/2016 134* 135 - 145 mmol/L Final  . Potassium 03/18/2016 3.8  3.5 - 5.1 mmol/L Final  . Chloride 03/18/2016 103  101 - 111 mmol/L Final  . CO2 03/18/2016 24  22 - 32 mmol/L Final  . Glucose, Bld 03/18/2016 130* 65 - 99 mg/dL Final  . BUN 03/18/2016 14  6 - 20 mg/dL Final  . Creatinine, Ser 03/18/2016 0.60  0.44 - 1.00 mg/dL Final  . Calcium 03/18/2016 8.5* 8.9 - 10.3 mg/dL Final  . GFR calc non Af Amer 03/18/2016 >60  >60 mL/min Final  . GFR calc Af Amer 03/18/2016 >60  >60 mL/min Final   Comment: (NOTE) The eGFR has been calculated using the CKD EPI equation. This calculation has not been validated in all clinical situations. eGFR's persistently <60 mL/min signify possible Chronic Kidney Disease.   . Anion gap 03/18/2016 7  5 - 15 Final  . WBC 03/18/2016 9.1  4.0 - 10.5 K/uL Final  . RBC 03/18/2016 4.12  3.87 - 5.11 MIL/uL Final  . Hemoglobin 03/18/2016 12.3  12.0 - 15.0 g/dL Final  . HCT 03/18/2016 39.0  36.0 - 46.0 % Final  . MCV 03/18/2016 94.7  78.0 - 100.0 fL Final  .  MCH 03/18/2016 29.9  26.0 - 34.0 pg Final  . MCHC 03/18/2016 31.5  30.0 - 36.0 g/dL Final  . RDW 03/18/2016 13.4  11.5 - 15.5 % Final  . Platelets 03/18/2016 253  150 - 400 K/uL Final  . MRSA, PCR 03/18/2016 NEGATIVE  NEGATIVE Final  . Staphylococcus aureus 03/18/2016 NEGATIVE  NEGATIVE Final   Comment:        The Xpert SA Assay (FDA approved for NASAL specimens in patients over 27  years of age), is one component of a comprehensive surveillance program.  Test performance has been validated by [facility] for patients greater than or equal to 78 year old. It is not intended to diagnose infection nor to guide or monitor treatment.   . Sodium 03/20/2016 138  135 - 145 mmol/L Final  . Potassium 03/20/2016 3.3* 3.5 - 5.1 mmol/L Final  . Chloride 03/20/2016 105  101 - 111 mmol/L Final  . CO2 03/20/2016 29  22 - 32 mmol/L Final  . Glucose, Bld 03/20/2016 129* 65 - 99 mg/dL Final  . BUN 03/20/2016 <5* 6 - 20 mg/dL Final  . Creatinine, Ser 03/20/2016 0.52  0.44 - 1.00 mg/dL Final  . Calcium 03/20/2016 7.9* 8.9 - 10.3 mg/dL Final  . GFR calc non Af Amer 03/20/2016 >60  >60 mL/min Final  . GFR calc Af Amer 03/20/2016 >60  >60 mL/min Final   Comment: (NOTE) The eGFR has been calculated using the CKD EPI equation. This calculation has not been validated in all clinical situations. eGFR's persistently <60 mL/min signify possible Chronic Kidney Disease.   . Anion gap 03/20/2016 4* 5 - 15 Final  . Magnesium 03/20/2016 1.8  1.7 - 2.4 mg/dL Final  . Sodium 03/21/2016 134* 135 - 145 mmol/L Final  . Potassium 03/21/2016 3.7  3.5 - 5.1 mmol/L Final  . Chloride 03/21/2016 100* 101 - 111 mmol/L Final  . CO2 03/21/2016 27  22 - 32 mmol/L Final  . Glucose, Bld 03/21/2016 121* 65 - 99 mg/dL Final  . BUN 03/21/2016 6  6 - 20 mg/dL Final  . Creatinine, Ser 03/21/2016 0.50  0.44 - 1.00 mg/dL Final  . Calcium 03/21/2016 7.9* 8.9 - 10.3 mg/dL Final  . GFR calc non Af Amer 03/21/2016 >60  >60 mL/min Final  . GFR calc Af Amer 03/21/2016 >60  >60 mL/min Final   Comment: (NOTE) The eGFR has been calculated using the CKD EPI equation. This calculation has not been validated in all clinical situations. eGFR's persistently <60 mL/min signify possible Chronic Kidney Disease.   . Anion gap 03/21/2016 7  5 - 15 Final  . Magnesium 03/21/2016 1.8  1.7 - 2.4 mg/dL Final  . WBC 03/22/2016 8.3   4.0 - 10.5 K/uL Final  . RBC 03/22/2016 2.99* 3.87 - 5.11 MIL/uL Final  . Hemoglobin 03/22/2016 9.1* 12.0 - 15.0 g/dL Final  . HCT 03/22/2016 28.0* 36.0 - 46.0 % Final  . MCV 03/22/2016 93.6  78.0 - 100.0 fL Final  . MCH 03/22/2016 30.4  26.0 - 34.0 pg Final  . MCHC 03/22/2016 32.5  30.0 - 36.0 g/dL Final  . RDW 03/22/2016 13.1  11.5 - 15.5 % Final  . Platelets 03/22/2016 280  150 - 400 K/uL Final  . Sodium 03/22/2016 136  135 - 145 mmol/L Final  . Potassium 03/22/2016 3.7  3.5 - 5.1 mmol/L Final  . Chloride 03/22/2016 101  101 - 111 mmol/L Final  . CO2 03/22/2016 29  22 - 32  mmol/L Final  . Glucose, Bld 03/22/2016 107* 65 - 99 mg/dL Final  . BUN 03/22/2016 10  6 - 20 mg/dL Final  . Creatinine, Ser 03/22/2016 0.56  0.44 - 1.00 mg/dL Final  . Calcium 03/22/2016 8.1* 8.9 - 10.3 mg/dL Final  . GFR calc non Af Amer 03/22/2016 >60  >60 mL/min Final  . GFR calc Af Amer 03/22/2016 >60  >60 mL/min Final   Comment: (NOTE) The eGFR has been calculated using the CKD EPI equation. This calculation has not been validated in all clinical situations. eGFR's persistently <60 mL/min signify possible Chronic Kidney Disease.   . Anion gap 03/22/2016 6  5 - 15 Final    Dg Lumbar Spine Complete  Result Date: 03/18/2016 CLINICAL DATA:  Posterior lumbar fusion with L1-5 decompression EXAM: DG C-ARM 61-120 MIN; LUMBAR SPINE - COMPLETE 4+ VIEW FLUOROSCOPY TIME:  52 seconds COMPARISON:  CT lumbar spine dated 03/17/2016 FINDINGS: Intraoperative fluoroscopic spot radiographs during posterior lumbar fixation. Mild superior endplate compression fracture deformity at L3. Pedicle screws at L1 and L2. IMPRESSION: Intraoperative fluoroscopic spot radiographs during posterior lumbar fixation, as above. Electronically Signed   By: Julian Hy M.D.   On: 03/18/2016 14:39   Dg Lumbar Spine Complete  Result Date: 03/17/2016 CLINICAL DATA:  Pt states that she fell off of a 5 foot ladder this morning while trying to  change the battery in the smoke detector. When she fell backwards she hit her back on the wall and fell to floor landing on her side. Pain in lower back and pelvis. Pain comes and goes and is excruciating. Limited mobility. EXAM: LUMBAR SPINE - COMPLETE 4+ VIEW COMPARISON:  None. FINDINGS: Acute fracture of L3. There is proximally 40% loss of anterior height. No definite retropulsion of fracture fragments. However, detail is limited given the portable nature of the exam. The fractures are identified. No spondylolisthesis. IMPRESSION: Acute fracture of L3. Electronically Signed   By: Nolon Nations M.D.   On: 03/17/2016 13:32   Ct Lumbar Spine Wo Contrast  Result Date: 03/17/2016 CLINICAL DATA:  Fall 15 feet from a ladder. Low back pain. L3 fracture on radiographs. EXAM: CT LUMBAR SPINE WITHOUT CONTRAST TECHNIQUE: Multidetector CT imaging of the lumbar spine was performed without intravenous contrast administration. Multiplanar CT image reconstructions were also generated. COMPARISON:  Lumbar spine radiographs earlier today FINDINGS: Lumbar segmentation is normal. As seen on earlier radiographs, there is an L3 vertebral body burst fracture with approximately 60% height loss centrally. Posterior vertebral body fragments are retropulsed approximately 6 mm into the canal with resultant mild to moderate spinal stenosis. No posterior element fracture is identified. Vertebral body heights are preserved elsewhere in the lumbar spine without additional fractures identified. Intervertebral disc space heights are preserved. There is no significant listhesis. Multilevel facet arthrosis is noted, moderate at L5-S1. Abdominal aortic atherosclerosis is noted. IMPRESSION: 1. L3 burst fracture with 60% height loss, 6 mm retropulsion, and mild-to-moderate spinal stenosis. 2. Aortic atherosclerosis. Electronically Signed   By: Logan Bores M.D.   On: 03/17/2016 16:10   Dg C-arm 1-60 Min  Result Date: 03/18/2016 CLINICAL DATA:   Posterior lumbar fusion with L1-5 decompression EXAM: DG C-ARM 61-120 MIN; LUMBAR SPINE - COMPLETE 4+ VIEW FLUOROSCOPY TIME:  52 seconds COMPARISON:  CT lumbar spine dated 03/17/2016 FINDINGS: Intraoperative fluoroscopic spot radiographs during posterior lumbar fixation. Mild superior endplate compression fracture deformity at L3. Pedicle screws at L1 and L2. IMPRESSION: Intraoperative fluoroscopic spot radiographs during posterior lumbar fixation, as  above. Electronically Signed   By: Julian Hy M.D.   On: 03/18/2016 14:39   Dg Hips Bilat W Or Wo Pelvis 3-4 Views  Result Date: 03/17/2016 CLINICAL DATA:  Pt states that she fell off of a 5 foot ladder this morning while trying to change the battery in the smoke detector. When she fell backwards she hit her back on the wall and fell to floor landing on her side. Pain in the lower back and pelvis comes and goes and is excruciating. EXAM: DG HIP (WITH OR WITHOUT PELVIS) 3-4V BILAT COMPARISON:  Lumbar spine same day FINDINGS: There is no evidence of hip fracture or dislocation. There is no evidence of arthropathy or other focal bone abnormality. IMPRESSION: Negative. Electronically Signed   By: Nolon Nations M.D.   On: 03/17/2016 13:34     Assessment/Plan   ICD-9-CM ICD-10-CM   1. History of back surgery V45.89 Z98.890   2. Closed compression fracture of L3 lumbar vertebra, with nonunion, subsequent encounter 733.82 S32.030K   3. Postoperative anemia due to acute blood loss 285.1 D62   4. Osteoporosis, unspecified osteoporosis type, unspecified pathological fracture presence 733.00 M81.0   5. Hypothyroidism due to acquired atrophy of thyroid 244.8 E03.4    246.8    6. Chronic constipation 564.00 K59.09   7. Severe single current episode of major depressive disorder, without psychotic features ([place]) 296.23 F32.2   8. Mixed hyperlipidemia 272.2 E78.2    Patient is being discharged with home health services:  nursing  Patient is being  discharged with the following durable medical equipment:  Rolling walker; 3-in-1 commode  Patient has been advised to f/u with their PCP in 1-2 weeks to bring them up to date on their rehab stay.  They were provided with a 30 day supply of scripts for prescription medications and refills must be obtained from their PCP.  TIME SPENT (MINUTES): [place]. Perlie Gold  [facility] [address] [place], [place] [address] [phone] Cell (Monday-Friday 8 AM - 5 PM) [phone] After 5 PM and follow prompts

## 2016-04-09 DIAGNOSIS — K5909 Other constipation: Secondary | ICD-10-CM | POA: Insufficient documentation

## 2016-04-09 DIAGNOSIS — F329 Major depressive disorder, single episode, unspecified: Secondary | ICD-10-CM | POA: Insufficient documentation

## 2016-04-09 DIAGNOSIS — F32A Depression, unspecified: Secondary | ICD-10-CM | POA: Insufficient documentation

## 2016-04-10 DIAGNOSIS — M810 Age-related osteoporosis without current pathological fracture: Secondary | ICD-10-CM | POA: Diagnosis not present

## 2016-04-10 DIAGNOSIS — S32030D Wedge compression fracture of third lumbar vertebra, subsequent encounter for fracture with routine healing: Secondary | ICD-10-CM | POA: Diagnosis not present

## 2016-04-10 DIAGNOSIS — F419 Anxiety disorder, unspecified: Secondary | ICD-10-CM | POA: Diagnosis not present

## 2016-04-10 DIAGNOSIS — F329 Major depressive disorder, single episode, unspecified: Secondary | ICD-10-CM | POA: Diagnosis not present

## 2016-09-17 DIAGNOSIS — S32032D Unstable burst fracture of third lumbar vertebra, subsequent encounter for fracture with routine healing: Secondary | ICD-10-CM | POA: Diagnosis not present

## 2016-09-17 DIAGNOSIS — M545 Low back pain: Secondary | ICD-10-CM | POA: Diagnosis not present

## 2016-09-17 DIAGNOSIS — M5416 Radiculopathy, lumbar region: Secondary | ICD-10-CM | POA: Diagnosis not present

## 2016-09-17 DIAGNOSIS — M549 Dorsalgia, unspecified: Secondary | ICD-10-CM | POA: Diagnosis not present

## 2016-09-27 DIAGNOSIS — S32032D Unstable burst fracture of third lumbar vertebra, subsequent encounter for fracture with routine healing: Secondary | ICD-10-CM | POA: Diagnosis not present

## 2016-09-27 DIAGNOSIS — Z91040 Latex allergy status: Secondary | ICD-10-CM | POA: Diagnosis not present

## 2016-09-27 DIAGNOSIS — M1991 Primary osteoarthritis, unspecified site: Secondary | ICD-10-CM | POA: Diagnosis not present

## 2016-09-27 DIAGNOSIS — M545 Low back pain: Secondary | ICD-10-CM | POA: Diagnosis not present

## 2016-09-27 DIAGNOSIS — M2560 Stiffness of unspecified joint, not elsewhere classified: Secondary | ICD-10-CM | POA: Diagnosis not present

## 2016-09-27 DIAGNOSIS — M810 Age-related osteoporosis without current pathological fracture: Secondary | ICD-10-CM | POA: Diagnosis not present

## 2016-09-27 DIAGNOSIS — R293 Abnormal posture: Secondary | ICD-10-CM | POA: Diagnosis not present

## 2016-09-27 DIAGNOSIS — R269 Unspecified abnormalities of gait and mobility: Secondary | ICD-10-CM | POA: Diagnosis not present

## 2016-10-02 DIAGNOSIS — M545 Low back pain: Secondary | ICD-10-CM | POA: Diagnosis not present

## 2016-10-02 DIAGNOSIS — M2560 Stiffness of unspecified joint, not elsewhere classified: Secondary | ICD-10-CM | POA: Diagnosis not present

## 2016-10-02 DIAGNOSIS — R293 Abnormal posture: Secondary | ICD-10-CM | POA: Diagnosis not present

## 2016-10-02 DIAGNOSIS — R269 Unspecified abnormalities of gait and mobility: Secondary | ICD-10-CM | POA: Diagnosis not present

## 2016-10-04 DIAGNOSIS — R293 Abnormal posture: Secondary | ICD-10-CM | POA: Diagnosis not present

## 2016-10-04 DIAGNOSIS — M545 Low back pain: Secondary | ICD-10-CM | POA: Diagnosis not present

## 2016-10-04 DIAGNOSIS — R269 Unspecified abnormalities of gait and mobility: Secondary | ICD-10-CM | POA: Diagnosis not present

## 2016-10-04 DIAGNOSIS — M2560 Stiffness of unspecified joint, not elsewhere classified: Secondary | ICD-10-CM | POA: Diagnosis not present

## 2016-10-09 DIAGNOSIS — R269 Unspecified abnormalities of gait and mobility: Secondary | ICD-10-CM | POA: Diagnosis not present

## 2016-10-09 DIAGNOSIS — M545 Low back pain: Secondary | ICD-10-CM | POA: Diagnosis not present

## 2016-10-09 DIAGNOSIS — M2560 Stiffness of unspecified joint, not elsewhere classified: Secondary | ICD-10-CM | POA: Diagnosis not present

## 2016-10-09 DIAGNOSIS — R293 Abnormal posture: Secondary | ICD-10-CM | POA: Diagnosis not present

## 2016-10-11 DIAGNOSIS — R269 Unspecified abnormalities of gait and mobility: Secondary | ICD-10-CM | POA: Diagnosis not present

## 2016-10-11 DIAGNOSIS — R293 Abnormal posture: Secondary | ICD-10-CM | POA: Diagnosis not present

## 2016-10-11 DIAGNOSIS — M2560 Stiffness of unspecified joint, not elsewhere classified: Secondary | ICD-10-CM | POA: Diagnosis not present

## 2016-10-11 DIAGNOSIS — M545 Low back pain: Secondary | ICD-10-CM | POA: Diagnosis not present

## 2016-10-16 DIAGNOSIS — M545 Low back pain: Secondary | ICD-10-CM | POA: Diagnosis not present

## 2016-10-16 DIAGNOSIS — R293 Abnormal posture: Secondary | ICD-10-CM | POA: Diagnosis not present

## 2016-10-16 DIAGNOSIS — M2560 Stiffness of unspecified joint, not elsewhere classified: Secondary | ICD-10-CM | POA: Diagnosis not present

## 2016-10-16 DIAGNOSIS — R269 Unspecified abnormalities of gait and mobility: Secondary | ICD-10-CM | POA: Diagnosis not present

## 2016-10-18 DIAGNOSIS — E038 Other specified hypothyroidism: Secondary | ICD-10-CM | POA: Diagnosis not present

## 2016-10-18 DIAGNOSIS — M810 Age-related osteoporosis without current pathological fracture: Secondary | ICD-10-CM | POA: Diagnosis not present

## 2016-10-18 DIAGNOSIS — R8299 Other abnormal findings in urine: Secondary | ICD-10-CM | POA: Diagnosis not present

## 2016-10-18 DIAGNOSIS — E7800 Pure hypercholesterolemia, unspecified: Secondary | ICD-10-CM | POA: Diagnosis not present

## 2016-10-19 DIAGNOSIS — R293 Abnormal posture: Secondary | ICD-10-CM | POA: Diagnosis not present

## 2016-10-19 DIAGNOSIS — M2560 Stiffness of unspecified joint, not elsewhere classified: Secondary | ICD-10-CM | POA: Diagnosis not present

## 2016-10-19 DIAGNOSIS — M545 Low back pain: Secondary | ICD-10-CM | POA: Diagnosis not present

## 2016-10-19 DIAGNOSIS — R269 Unspecified abnormalities of gait and mobility: Secondary | ICD-10-CM | POA: Diagnosis not present

## 2016-10-23 DIAGNOSIS — R269 Unspecified abnormalities of gait and mobility: Secondary | ICD-10-CM | POA: Diagnosis not present

## 2016-10-23 DIAGNOSIS — R293 Abnormal posture: Secondary | ICD-10-CM | POA: Diagnosis not present

## 2016-10-23 DIAGNOSIS — M545 Low back pain: Secondary | ICD-10-CM | POA: Diagnosis not present

## 2016-10-23 DIAGNOSIS — M2560 Stiffness of unspecified joint, not elsewhere classified: Secondary | ICD-10-CM | POA: Diagnosis not present

## 2016-10-25 DIAGNOSIS — Z6824 Body mass index (BMI) 24.0-24.9, adult: Secondary | ICD-10-CM | POA: Diagnosis not present

## 2016-10-25 DIAGNOSIS — F17210 Nicotine dependence, cigarettes, uncomplicated: Secondary | ICD-10-CM | POA: Diagnosis not present

## 2016-10-25 DIAGNOSIS — Z0000 Encounter for general adult medical examination without abnormal findings: Secondary | ICD-10-CM | POA: Diagnosis not present

## 2016-10-25 DIAGNOSIS — D500 Iron deficiency anemia secondary to blood loss (chronic): Secondary | ICD-10-CM | POA: Diagnosis not present

## 2016-10-25 DIAGNOSIS — Z1389 Encounter for screening for other disorder: Secondary | ICD-10-CM | POA: Diagnosis not present

## 2016-10-25 DIAGNOSIS — G4709 Other insomnia: Secondary | ICD-10-CM | POA: Diagnosis not present

## 2016-10-25 DIAGNOSIS — F331 Major depressive disorder, recurrent, moderate: Secondary | ICD-10-CM | POA: Diagnosis not present

## 2016-10-25 DIAGNOSIS — M810 Age-related osteoporosis without current pathological fracture: Secondary | ICD-10-CM | POA: Diagnosis not present

## 2016-10-25 DIAGNOSIS — E7800 Pure hypercholesterolemia, unspecified: Secondary | ICD-10-CM | POA: Diagnosis not present

## 2016-10-25 DIAGNOSIS — E038 Other specified hypothyroidism: Secondary | ICD-10-CM | POA: Diagnosis not present

## 2016-10-25 DIAGNOSIS — D692 Other nonthrombocytopenic purpura: Secondary | ICD-10-CM | POA: Diagnosis not present

## 2016-10-26 DIAGNOSIS — R293 Abnormal posture: Secondary | ICD-10-CM | POA: Diagnosis not present

## 2016-10-26 DIAGNOSIS — M2560 Stiffness of unspecified joint, not elsewhere classified: Secondary | ICD-10-CM | POA: Diagnosis not present

## 2016-10-26 DIAGNOSIS — M545 Low back pain: Secondary | ICD-10-CM | POA: Diagnosis not present

## 2016-10-26 DIAGNOSIS — R269 Unspecified abnormalities of gait and mobility: Secondary | ICD-10-CM | POA: Diagnosis not present

## 2016-10-30 DIAGNOSIS — R293 Abnormal posture: Secondary | ICD-10-CM | POA: Diagnosis not present

## 2016-10-30 DIAGNOSIS — R269 Unspecified abnormalities of gait and mobility: Secondary | ICD-10-CM | POA: Diagnosis not present

## 2016-10-30 DIAGNOSIS — M2560 Stiffness of unspecified joint, not elsewhere classified: Secondary | ICD-10-CM | POA: Diagnosis not present

## 2016-10-30 DIAGNOSIS — M545 Low back pain: Secondary | ICD-10-CM | POA: Diagnosis not present

## 2016-11-02 DIAGNOSIS — M2560 Stiffness of unspecified joint, not elsewhere classified: Secondary | ICD-10-CM | POA: Diagnosis not present

## 2016-11-02 DIAGNOSIS — M545 Low back pain: Secondary | ICD-10-CM | POA: Diagnosis not present

## 2016-11-02 DIAGNOSIS — R293 Abnormal posture: Secondary | ICD-10-CM | POA: Diagnosis not present

## 2016-11-02 DIAGNOSIS — R269 Unspecified abnormalities of gait and mobility: Secondary | ICD-10-CM | POA: Diagnosis not present

## 2016-11-06 DIAGNOSIS — M545 Low back pain: Secondary | ICD-10-CM | POA: Diagnosis not present

## 2016-11-06 DIAGNOSIS — R269 Unspecified abnormalities of gait and mobility: Secondary | ICD-10-CM | POA: Diagnosis not present

## 2016-11-06 DIAGNOSIS — R293 Abnormal posture: Secondary | ICD-10-CM | POA: Diagnosis not present

## 2016-11-06 DIAGNOSIS — M2560 Stiffness of unspecified joint, not elsewhere classified: Secondary | ICD-10-CM | POA: Diagnosis not present

## 2016-11-08 DIAGNOSIS — M2560 Stiffness of unspecified joint, not elsewhere classified: Secondary | ICD-10-CM | POA: Diagnosis not present

## 2016-11-08 DIAGNOSIS — M545 Low back pain: Secondary | ICD-10-CM | POA: Diagnosis not present

## 2016-11-08 DIAGNOSIS — R293 Abnormal posture: Secondary | ICD-10-CM | POA: Diagnosis not present

## 2016-11-08 DIAGNOSIS — R269 Unspecified abnormalities of gait and mobility: Secondary | ICD-10-CM | POA: Diagnosis not present

## 2016-11-12 DIAGNOSIS — M545 Low back pain: Secondary | ICD-10-CM | POA: Diagnosis not present

## 2016-11-12 DIAGNOSIS — R269 Unspecified abnormalities of gait and mobility: Secondary | ICD-10-CM | POA: Diagnosis not present

## 2016-11-12 DIAGNOSIS — M2560 Stiffness of unspecified joint, not elsewhere classified: Secondary | ICD-10-CM | POA: Diagnosis not present

## 2016-11-12 DIAGNOSIS — R293 Abnormal posture: Secondary | ICD-10-CM | POA: Diagnosis not present

## 2016-11-14 DIAGNOSIS — M2560 Stiffness of unspecified joint, not elsewhere classified: Secondary | ICD-10-CM | POA: Diagnosis not present

## 2016-11-14 DIAGNOSIS — R293 Abnormal posture: Secondary | ICD-10-CM | POA: Diagnosis not present

## 2016-11-14 DIAGNOSIS — R269 Unspecified abnormalities of gait and mobility: Secondary | ICD-10-CM | POA: Diagnosis not present

## 2016-11-14 DIAGNOSIS — M545 Low back pain: Secondary | ICD-10-CM | POA: Diagnosis not present

## 2017-01-22 DIAGNOSIS — M810 Age-related osteoporosis without current pathological fracture: Secondary | ICD-10-CM | POA: Diagnosis not present

## 2017-10-24 DIAGNOSIS — E7800 Pure hypercholesterolemia, unspecified: Secondary | ICD-10-CM | POA: Diagnosis not present

## 2017-10-24 DIAGNOSIS — E039 Hypothyroidism, unspecified: Secondary | ICD-10-CM | POA: Diagnosis not present

## 2017-10-24 DIAGNOSIS — R82998 Other abnormal findings in urine: Secondary | ICD-10-CM | POA: Diagnosis not present

## 2017-10-24 DIAGNOSIS — M810 Age-related osteoporosis without current pathological fracture: Secondary | ICD-10-CM | POA: Diagnosis not present

## 2017-11-08 ENCOUNTER — Other Ambulatory Visit: Payer: Self-pay | Admitting: Internal Medicine

## 2017-11-08 DIAGNOSIS — Z1231 Encounter for screening mammogram for malignant neoplasm of breast: Secondary | ICD-10-CM

## 2017-11-08 DIAGNOSIS — F331 Major depressive disorder, recurrent, moderate: Secondary | ICD-10-CM | POA: Diagnosis not present

## 2017-11-08 DIAGNOSIS — Z0000 Encounter for general adult medical examination without abnormal findings: Secondary | ICD-10-CM | POA: Diagnosis not present

## 2017-11-08 DIAGNOSIS — G4709 Other insomnia: Secondary | ICD-10-CM | POA: Diagnosis not present

## 2017-11-08 DIAGNOSIS — E038 Other specified hypothyroidism: Secondary | ICD-10-CM | POA: Diagnosis not present

## 2017-11-08 DIAGNOSIS — Z6827 Body mass index (BMI) 27.0-27.9, adult: Secondary | ICD-10-CM | POA: Diagnosis not present

## 2017-11-08 DIAGNOSIS — F17210 Nicotine dependence, cigarettes, uncomplicated: Secondary | ICD-10-CM | POA: Diagnosis not present

## 2017-11-08 DIAGNOSIS — E7800 Pure hypercholesterolemia, unspecified: Secondary | ICD-10-CM | POA: Diagnosis not present

## 2017-11-08 DIAGNOSIS — D692 Other nonthrombocytopenic purpura: Secondary | ICD-10-CM | POA: Diagnosis not present

## 2017-11-08 DIAGNOSIS — M810 Age-related osteoporosis without current pathological fracture: Secondary | ICD-10-CM | POA: Diagnosis not present

## 2017-11-08 DIAGNOSIS — I959 Hypotension, unspecified: Secondary | ICD-10-CM | POA: Diagnosis not present

## 2017-11-08 DIAGNOSIS — Z1389 Encounter for screening for other disorder: Secondary | ICD-10-CM | POA: Diagnosis not present

## 2017-11-11 ENCOUNTER — Other Ambulatory Visit: Payer: Self-pay | Admitting: Internal Medicine

## 2017-11-12 ENCOUNTER — Other Ambulatory Visit: Payer: Self-pay | Admitting: Internal Medicine

## 2017-11-12 DIAGNOSIS — F17200 Nicotine dependence, unspecified, uncomplicated: Secondary | ICD-10-CM

## 2017-11-19 ENCOUNTER — Ambulatory Visit
Admission: RE | Admit: 2017-11-19 | Discharge: 2017-11-19 | Disposition: A | Discharge status: Home / Self Care | Payer: PPO | Source: Ambulatory Visit | Attending: Internal Medicine | Admitting: Internal Medicine

## 2017-11-19 DIAGNOSIS — F17200 Nicotine dependence, unspecified, uncomplicated: Secondary | ICD-10-CM

## 2017-11-19 DIAGNOSIS — I6523 Occlusion and stenosis of bilateral carotid arteries: Secondary | ICD-10-CM | POA: Diagnosis not present

## 2017-11-19 DIAGNOSIS — M545 Low back pain: Secondary | ICD-10-CM | POA: Diagnosis not present

## 2017-11-27 DIAGNOSIS — Z1212 Encounter for screening for malignant neoplasm of rectum: Secondary | ICD-10-CM | POA: Diagnosis not present

## 2017-12-03 ENCOUNTER — Ambulatory Visit
Admission: RE | Admit: 2017-12-03 | Discharge: 2017-12-03 | Disposition: A | Discharge status: Home / Self Care | Payer: PPO | Source: Ambulatory Visit | Attending: Internal Medicine | Admitting: Internal Medicine

## 2017-12-03 DIAGNOSIS — Z1231 Encounter for screening mammogram for malignant neoplasm of breast: Secondary | ICD-10-CM

## 2017-12-03 DIAGNOSIS — M545 Low back pain: Secondary | ICD-10-CM | POA: Diagnosis not present

## 2017-12-05 DIAGNOSIS — M545 Low back pain: Secondary | ICD-10-CM | POA: Diagnosis not present

## 2017-12-10 DIAGNOSIS — M545 Low back pain: Secondary | ICD-10-CM | POA: Diagnosis not present

## 2017-12-13 DIAGNOSIS — M545 Low back pain: Secondary | ICD-10-CM | POA: Diagnosis not present

## 2017-12-17 DIAGNOSIS — M545 Low back pain: Secondary | ICD-10-CM | POA: Diagnosis not present

## 2017-12-20 DIAGNOSIS — M545 Low back pain: Secondary | ICD-10-CM | POA: Diagnosis not present

## 2018-01-03 IMAGING — DX DG LUMBAR SPINE COMPLETE 4+V
4 series · 4 of 4 positions shown · non-contrast
Comparison: None.

CLINICAL DATA: Pt states that she fell off of a 5 foot ladder this
morning while trying to change the battery in the smoke detector.
When she fell backwards she hit her back on the wall and fell to
floor landing on her side. Pain in lower back and pelvis. Pain comes
and goes and is excruciating. Limited mobility.

EXAM:
LUMBAR SPINE - COMPLETE 4+ VIEW

[l-spine ap]
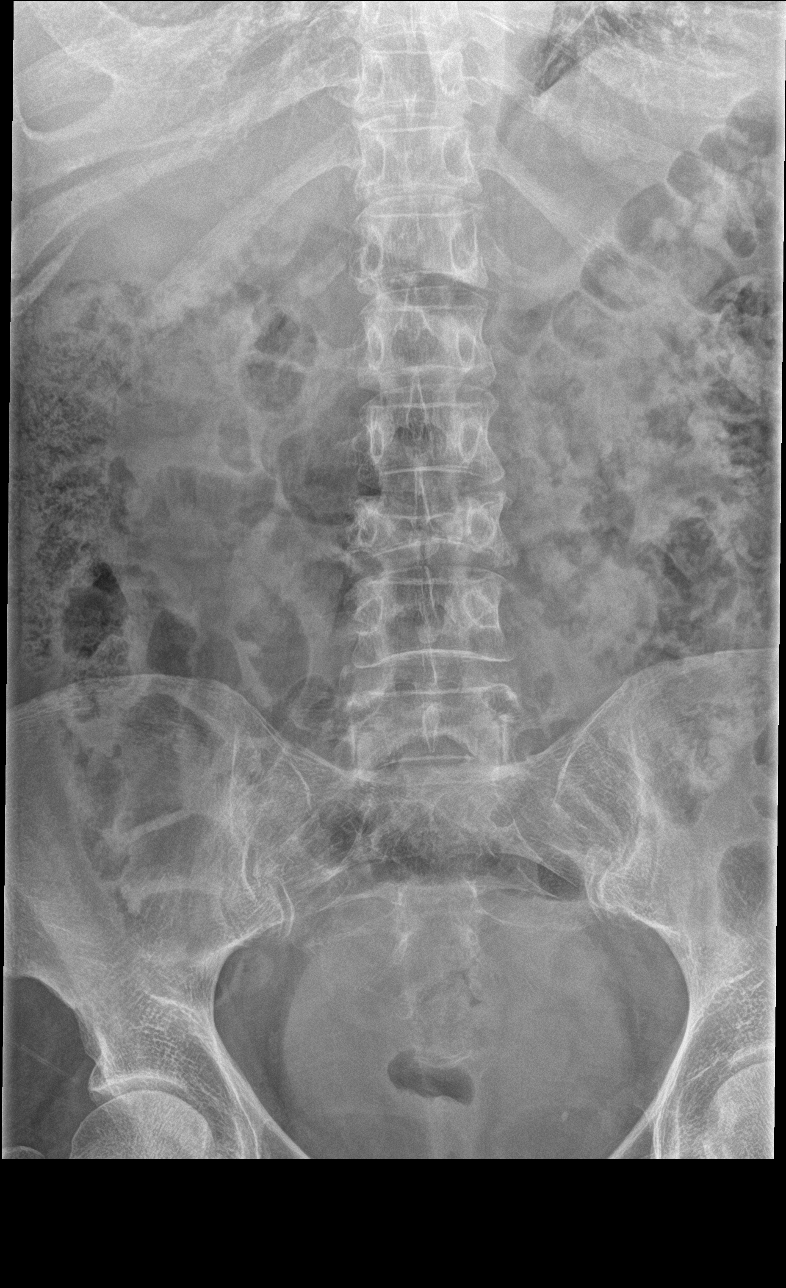

[l-spine obl (1 of 2)]
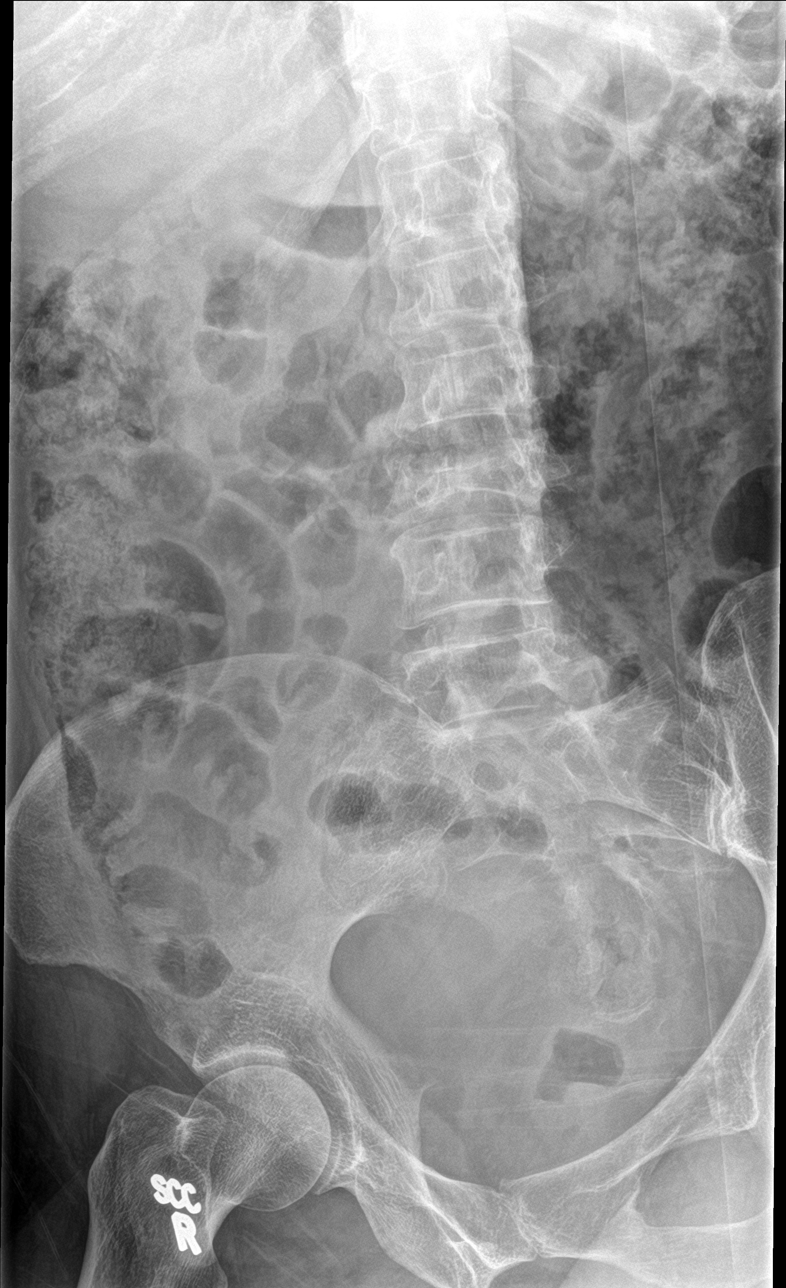

[l-spine obl (2 of 2)]
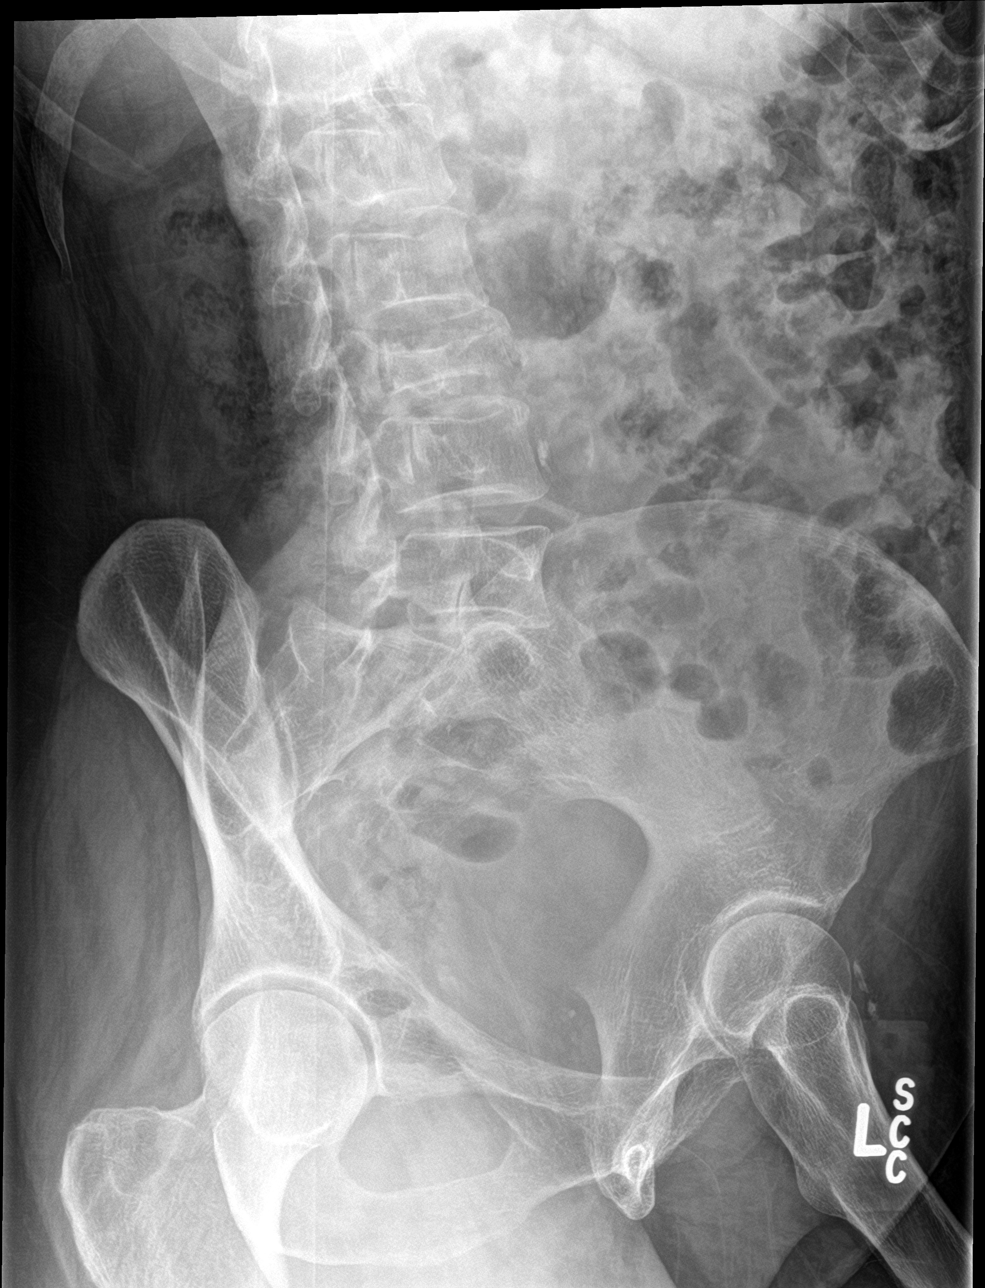

[l-spine lat]
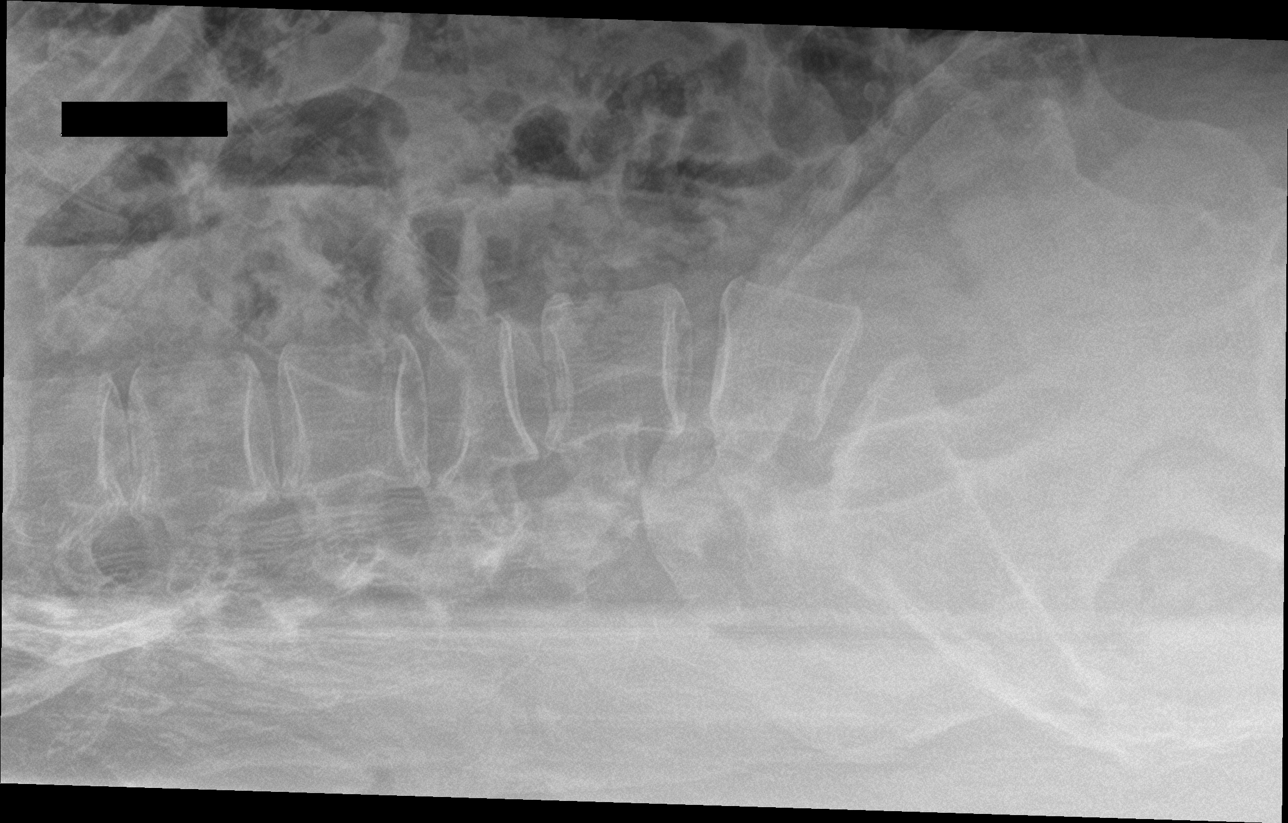

[4 of 4 positions shown; findings below may reference images not displayed]

FINDINGS: Acute fracture of L3. There is proximally 40% loss of anterior
height. No definite retropulsion of fracture fragments. However,
detail is limited given the portable nature of the exam. The
fractures are identified. No spondylolisthesis.
IMPRESSION: Acute fracture of L3.

## 2018-01-03 IMAGING — DX DG HIP (WITH OR WITHOUT PELVIS) 3-4V BILAT
5 series · 5 of 5 positions shown · non-contrast
Comparison: Lumbar spine same day

CLINICAL DATA: Pt states that she fell off of a 5 foot ladder this
morning while trying to change the battery in the smoke detector.
When she fell backwards she hit her back on the wall and fell to
floor landing on her side. Pain in the lower back and pelvis comes
and goes and is excruciating.

EXAM:
DG HIP (WITH OR WITHOUT PELVIS) 3-4V BILAT

[pelvis ap]
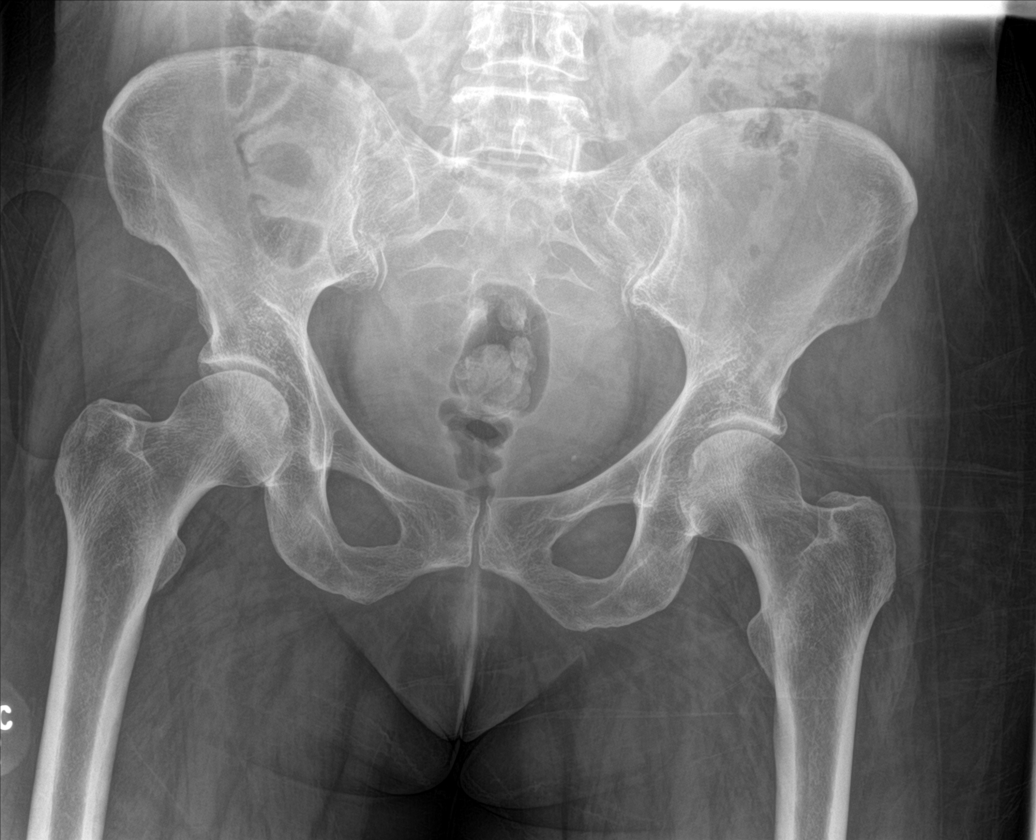

[hip ap (1 of 2)]
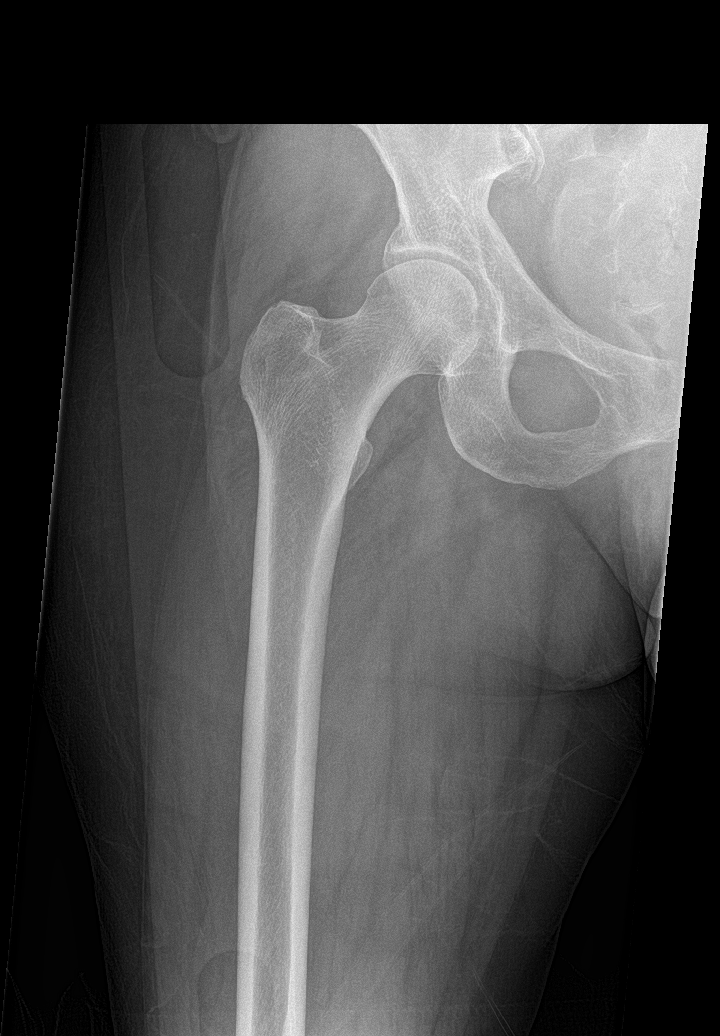

[hip lat (1 of 2)]
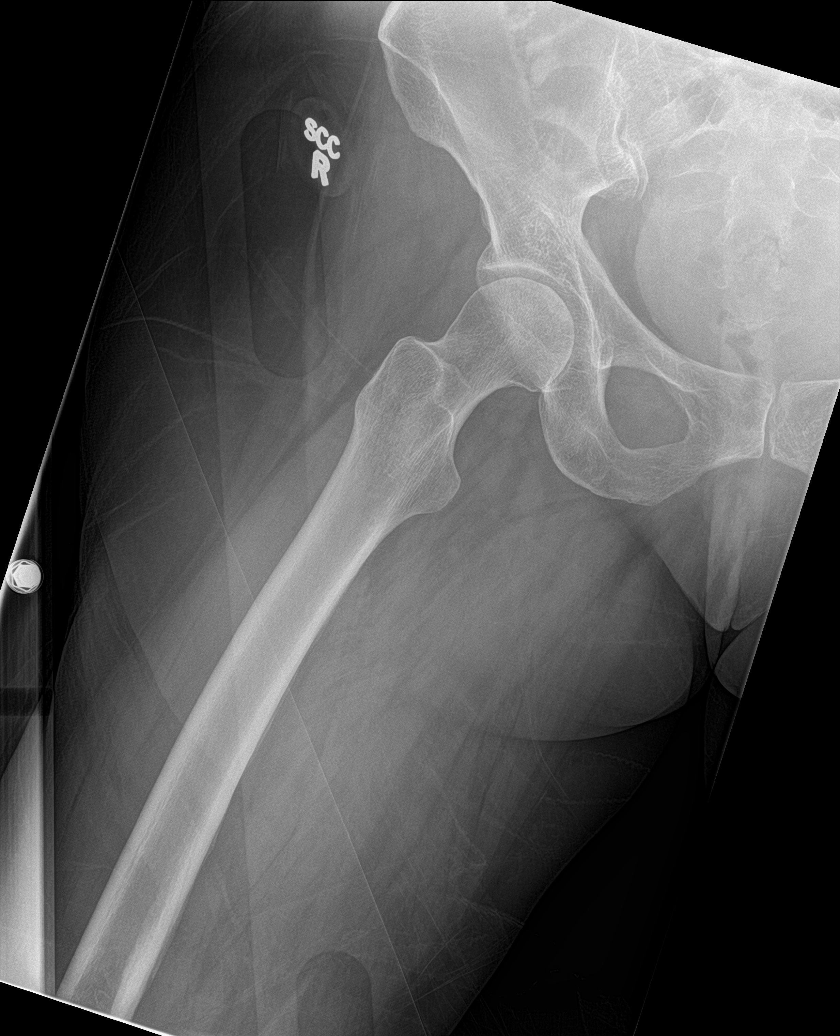

[hip ap (2 of 2)]
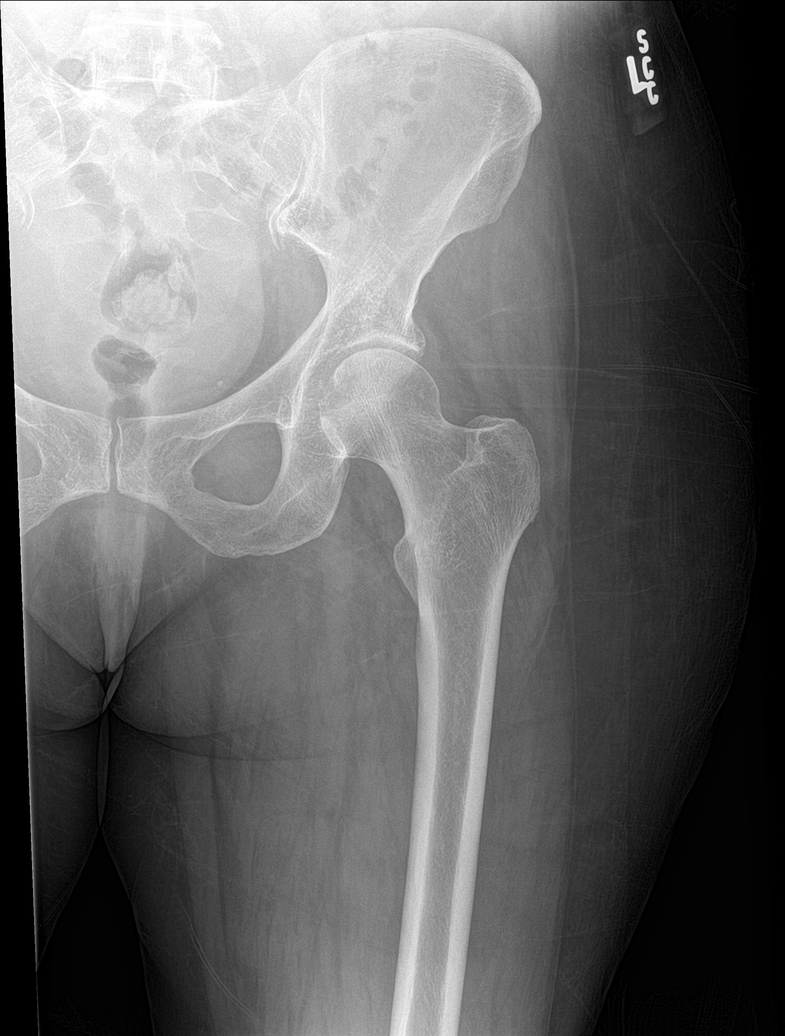

[hip lat (2 of 2)]
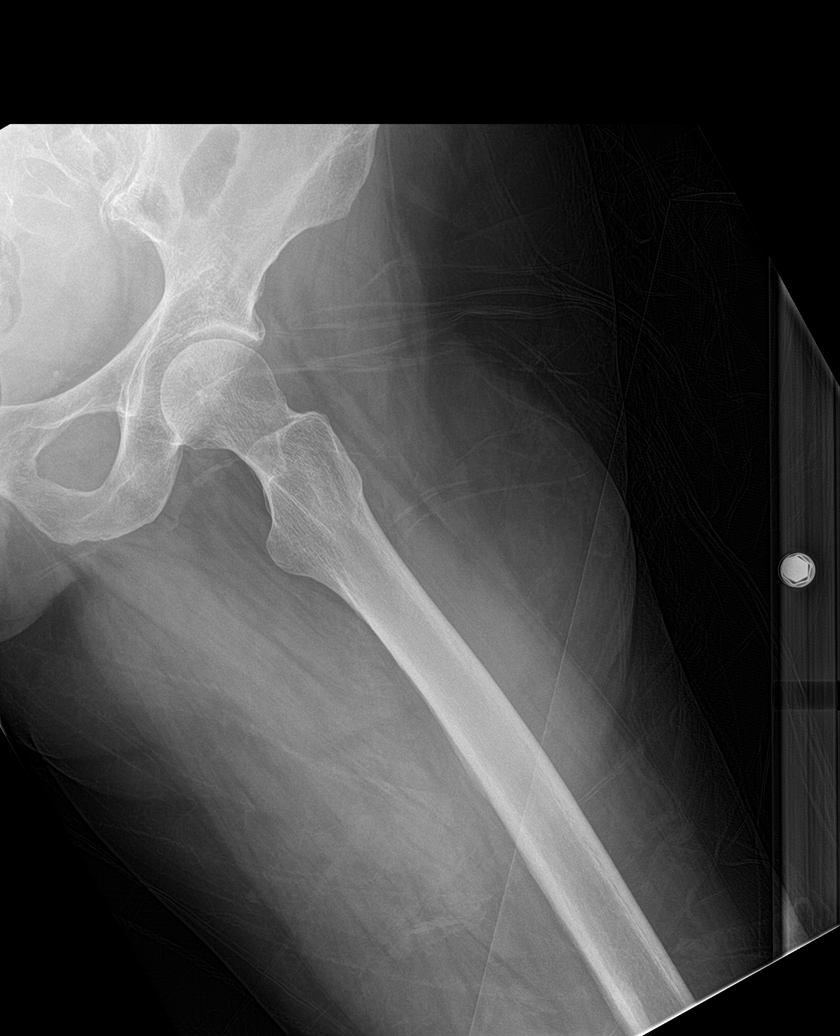

[5 of 5 positions shown; findings below may reference images not displayed]

FINDINGS: There is no evidence of hip fracture or dislocation. There is no
evidence of arthropathy or other focal bone abnormality.
IMPRESSION: Negative.

## 2018-01-03 IMAGING — CT CT L SPINE W/O CM
3 series · 12 of 33 positions shown, 14 images · non-contrast
Comparison: Lumbar spine radiographs earlier today

CLINICAL DATA: Fall 15 feet from a ladder. Low back pain. L3
fracture on radiographs.

EXAM:
CT LUMBAR SPINE WITHOUT CONTRAST
TECHNIQUE: Multidetector CT imaging of the lumbar spine was performed without
intravenous contrast administration. Multiplanar CT image
reconstructions were also generated.

[Series 4: l spine 2.0 i30s 3 · axial · 0.28mm/px · z∈[+1017,+1145]mm · 4 of 94 slices shown, 5 images]
[im 15/94  soft-tissue]
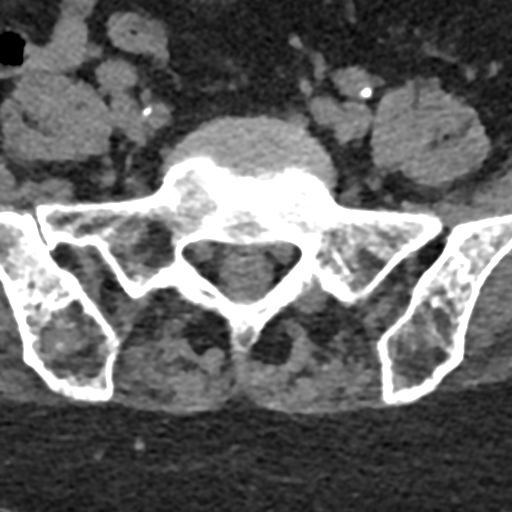
[im 15/94  bone]
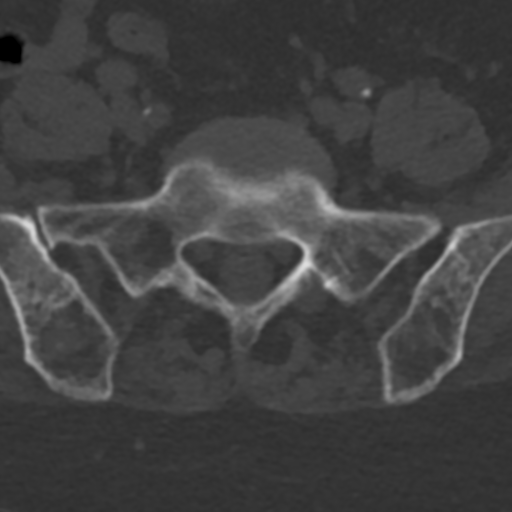
[im 36/94  bone]
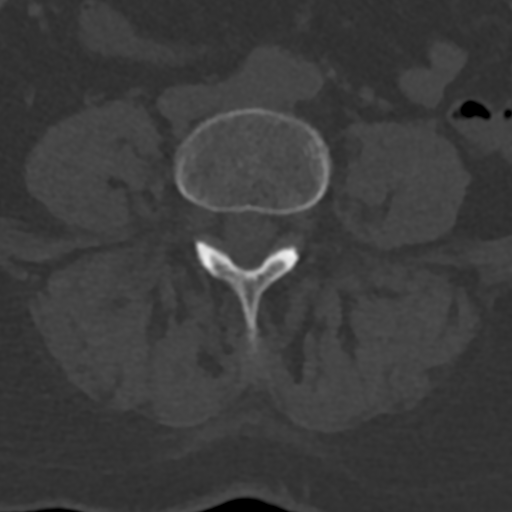
[im 58/94  bone]
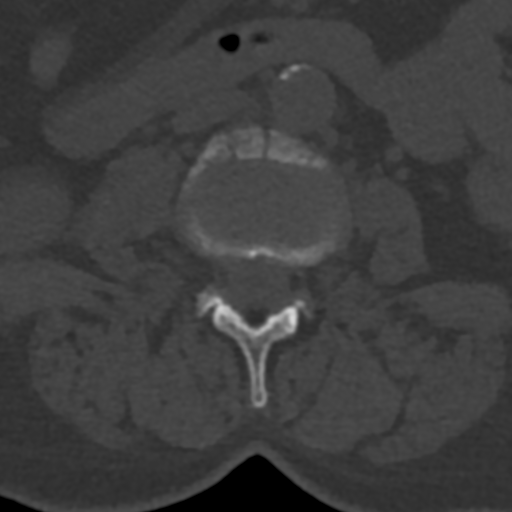
[im 79/94  bone]
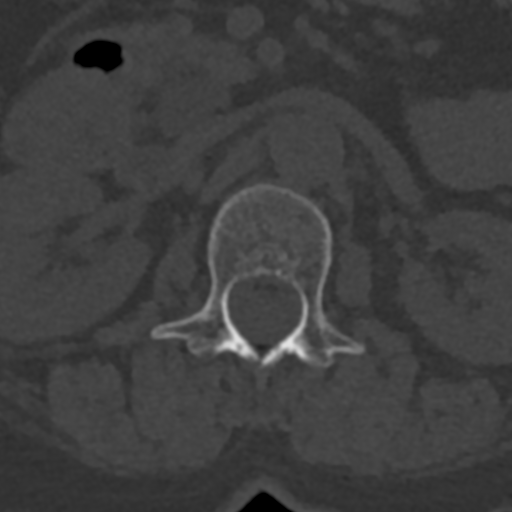

[Series 7: coronal st · coronal · 0.21mm/px · 3 of 56 slices shown]
[im 12/56  bone]
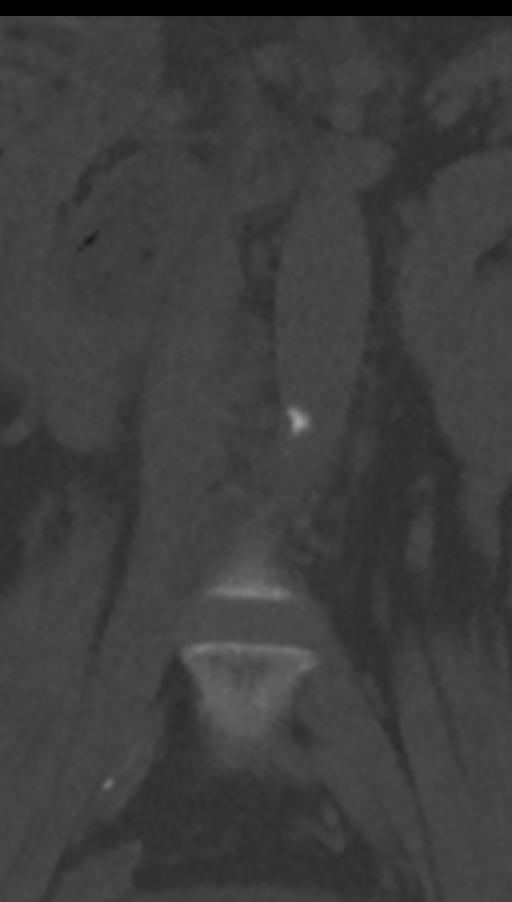
[im 23/56  bone]
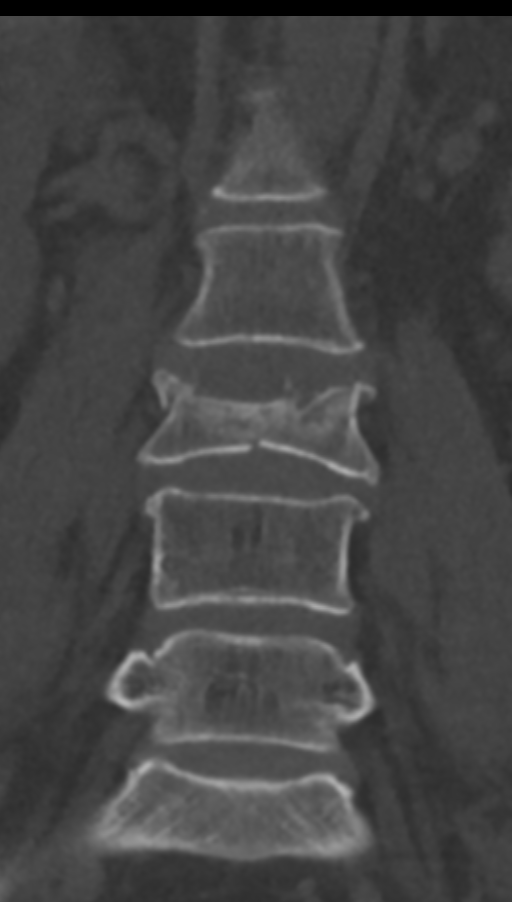
[im 34/56  bone]
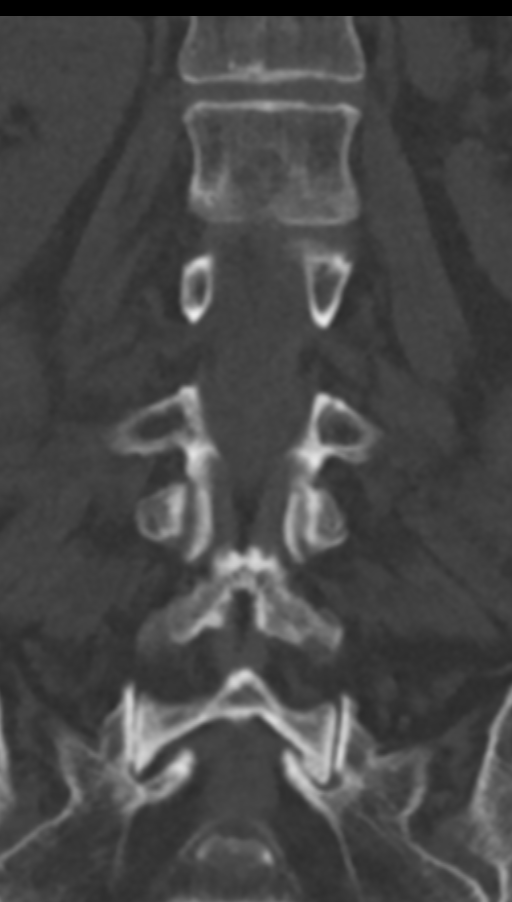

[Series 8: sagittal st · sagittal · 0.23mm/px · 5 of 42 slices shown, 6 images]
[im 14/42  bone]
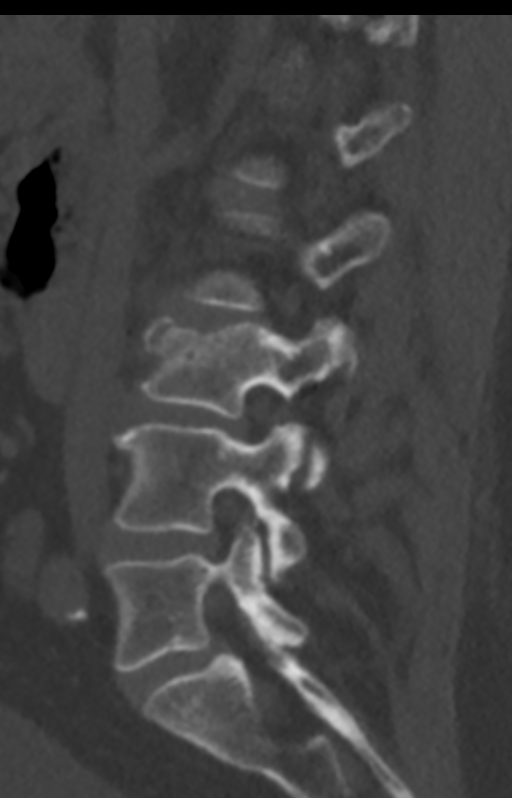
[im 18/42  bone]
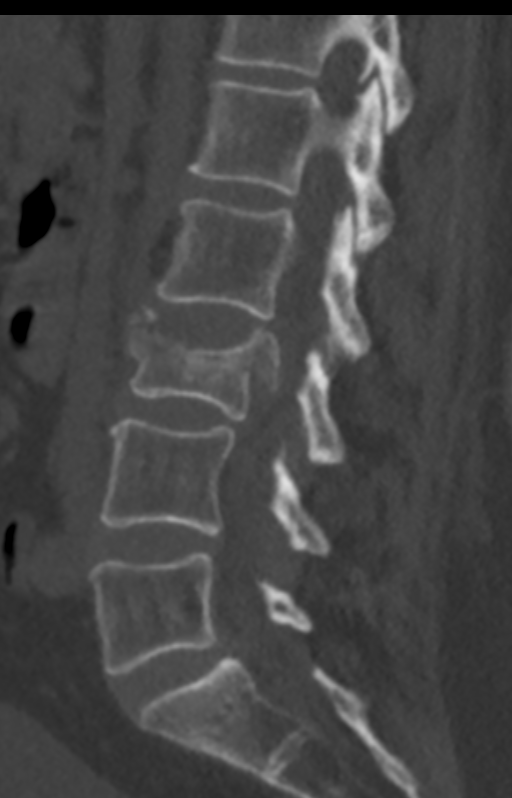
[im 21/42  soft-tissue]
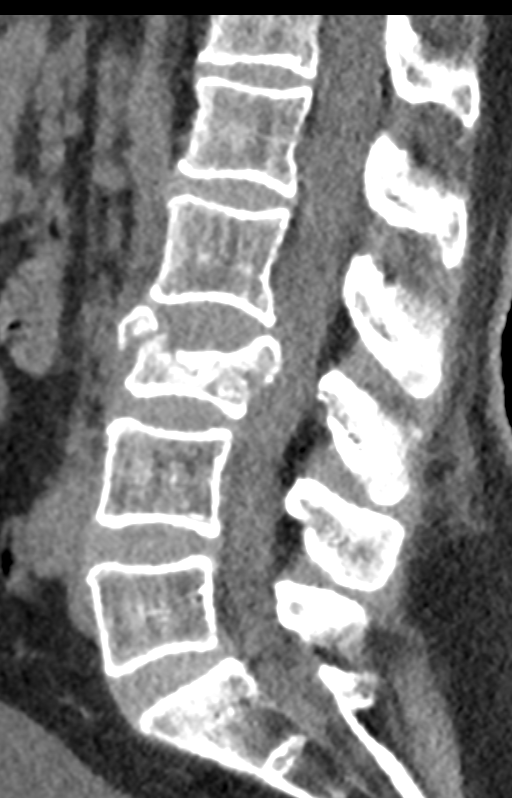
[im 21/42  bone]
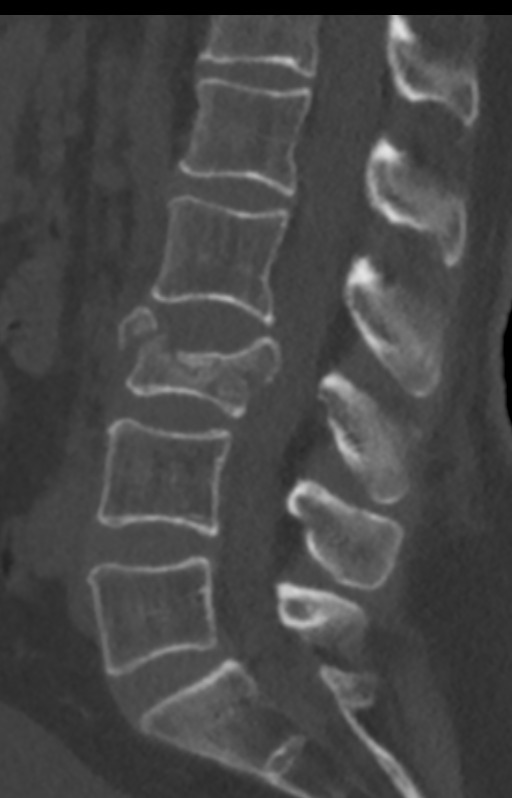
[im 24/42  bone]
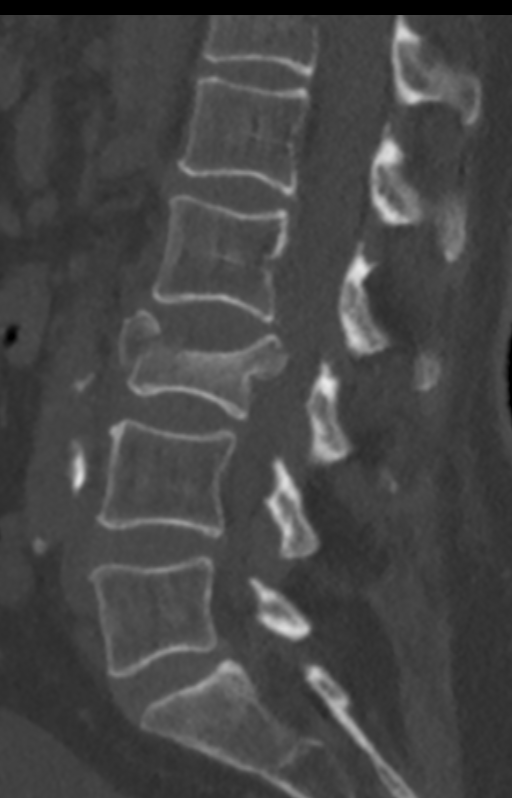
[im 28/42  bone]
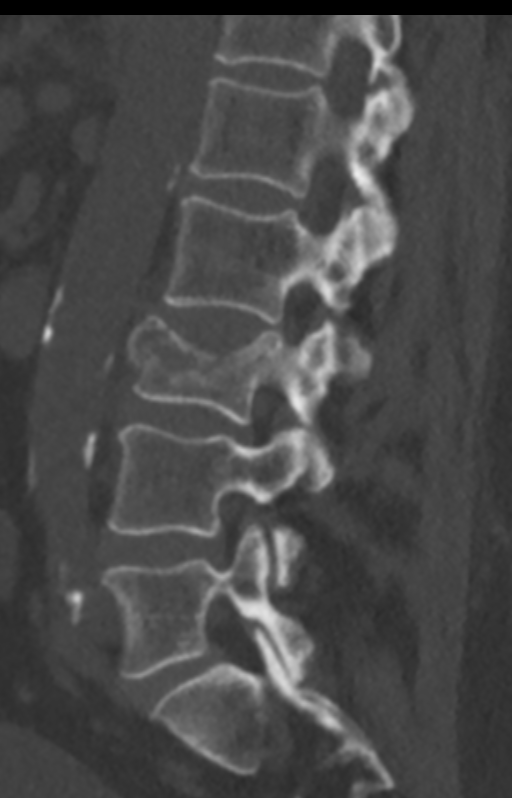

[12 of 33 positions shown; findings below may reference images not displayed]

FINDINGS: Lumbar segmentation is normal. As seen on earlier radiographs, there
is an L3 vertebral body burst fracture with approximately 60% height
loss centrally. Posterior vertebral body fragments are retropulsed
approximately 6 mm into the canal with resultant mild to moderate
spinal stenosis. No posterior element fracture is identified.

Vertebral body heights are preserved elsewhere in the lumbar spine
without additional fractures identified. Intervertebral disc space
heights are preserved. There is no significant listhesis. Multilevel
facet arthrosis is noted, moderate at L5-S1. Abdominal aortic
atherosclerosis is noted.
IMPRESSION: 1. L3 burst fracture with 60% height loss, 6 mm retropulsion, and
mild-to-moderate spinal stenosis.
2. Aortic atherosclerosis.

## 2018-11-07 DIAGNOSIS — E038 Other specified hypothyroidism: Secondary | ICD-10-CM | POA: Diagnosis not present

## 2018-11-07 DIAGNOSIS — E7800 Pure hypercholesterolemia, unspecified: Secondary | ICD-10-CM | POA: Diagnosis not present

## 2018-11-07 DIAGNOSIS — M810 Age-related osteoporosis without current pathological fracture: Secondary | ICD-10-CM | POA: Diagnosis not present

## 2018-11-10 DIAGNOSIS — R82998 Other abnormal findings in urine: Secondary | ICD-10-CM | POA: Diagnosis not present

## 2018-11-14 DIAGNOSIS — Z0000 Encounter for general adult medical examination without abnormal findings: Secondary | ICD-10-CM | POA: Diagnosis not present

## 2018-11-14 DIAGNOSIS — Z1331 Encounter for screening for depression: Secondary | ICD-10-CM | POA: Diagnosis not present

## 2018-11-14 DIAGNOSIS — R69 Illness, unspecified: Secondary | ICD-10-CM | POA: Diagnosis not present

## 2018-11-14 DIAGNOSIS — D692 Other nonthrombocytopenic purpura: Secondary | ICD-10-CM | POA: Diagnosis not present

## 2018-11-14 DIAGNOSIS — E7800 Pure hypercholesterolemia, unspecified: Secondary | ICD-10-CM | POA: Diagnosis not present

## 2018-11-14 DIAGNOSIS — G4700 Insomnia, unspecified: Secondary | ICD-10-CM | POA: Diagnosis not present

## 2018-11-14 DIAGNOSIS — M810 Age-related osteoporosis without current pathological fracture: Secondary | ICD-10-CM | POA: Diagnosis not present

## 2018-11-14 DIAGNOSIS — M1990 Unspecified osteoarthritis, unspecified site: Secondary | ICD-10-CM | POA: Diagnosis not present

## 2018-11-14 DIAGNOSIS — Z87891 Personal history of nicotine dependence: Secondary | ICD-10-CM | POA: Diagnosis not present

## 2018-11-14 DIAGNOSIS — E785 Hyperlipidemia, unspecified: Secondary | ICD-10-CM | POA: Diagnosis not present

## 2018-11-14 DIAGNOSIS — Z880 Allergy status to penicillin: Secondary | ICD-10-CM | POA: Diagnosis not present

## 2018-11-14 DIAGNOSIS — T148XXD Other injury of unspecified body region, subsequent encounter: Secondary | ICD-10-CM | POA: Diagnosis not present

## 2018-11-14 DIAGNOSIS — Z791 Long term (current) use of non-steroidal anti-inflammatories (NSAID): Secondary | ICD-10-CM | POA: Diagnosis not present

## 2018-11-14 DIAGNOSIS — Z1339 Encounter for screening examination for other mental health and behavioral disorders: Secondary | ICD-10-CM | POA: Diagnosis not present

## 2018-11-14 DIAGNOSIS — Z882 Allergy status to sulfonamides status: Secondary | ICD-10-CM | POA: Diagnosis not present

## 2018-11-14 DIAGNOSIS — M545 Low back pain: Secondary | ICD-10-CM | POA: Diagnosis not present

## 2018-11-14 DIAGNOSIS — Z7983 Long term (current) use of bisphosphonates: Secondary | ICD-10-CM | POA: Diagnosis not present

## 2018-11-14 DIAGNOSIS — E039 Hypothyroidism, unspecified: Secondary | ICD-10-CM | POA: Diagnosis not present

## 2018-12-25 DIAGNOSIS — Z20828 Contact with and (suspected) exposure to other viral communicable diseases: Secondary | ICD-10-CM | POA: Diagnosis not present

## 2019-07-13 DIAGNOSIS — R432 Parageusia: Secondary | ICD-10-CM | POA: Diagnosis not present

## 2019-07-13 DIAGNOSIS — J0100 Acute maxillary sinusitis, unspecified: Secondary | ICD-10-CM | POA: Diagnosis not present

## 2019-07-13 DIAGNOSIS — R5383 Other fatigue: Secondary | ICD-10-CM | POA: Diagnosis not present

## 2019-07-13 DIAGNOSIS — Z20828 Contact with and (suspected) exposure to other viral communicable diseases: Secondary | ICD-10-CM | POA: Diagnosis not present

## 2019-08-11 DIAGNOSIS — Z20828 Contact with and (suspected) exposure to other viral communicable diseases: Secondary | ICD-10-CM | POA: Diagnosis not present

## 2019-08-24 ENCOUNTER — Ambulatory Visit: Payer: Medicare HMO | Attending: Internal Medicine

## 2019-08-24 DIAGNOSIS — Z23 Encounter for immunization: Secondary | ICD-10-CM | POA: Insufficient documentation

## 2019-08-24 NOTE — Progress Notes (Signed)
   Covid-19 Vaccination Clinic  Name:  Lorelle Macaluso    MRN: [id number] DOB: [birth year]  08/24/2019  Ms. Valladares was observed post Covid-19 immunization for 30 minutes based on pre-vaccination screening without incidence. She was provided with Vaccine Information Sheet and instruction to access the V-Safe system.   Ms. Poteat was instructed to call 911 with any severe reactions post vaccine: Marland Kitchen Difficulty breathing  . Swelling of your face and throat  . A fast heartbeat  . A bad rash all over your body  . Dizziness and weakness    Immunizations Administered    Name Date Dose VIS Date Route   Pfizer COVID-19 Vaccine 08/24/2019  2:00 PM 0.3 mL 06/26/2019 Intramuscular   Manufacturer: [employer], [employer]   Lot: [id number]   NDC: [address]-5

## 2019-09-18 ENCOUNTER — Ambulatory Visit: Payer: Medicare HMO | Attending: Internal Medicine

## 2019-09-18 DIAGNOSIS — Z23 Encounter for immunization: Secondary | ICD-10-CM | POA: Insufficient documentation

## 2019-09-18 NOTE — Progress Notes (Signed)
   Covid-19 Vaccination Clinic  Name:  Crystal Trujillo    MRN: [id number] DOB: [birth year]  09/18/2019  [name] was observed post Covid-19 immunization for 15 minutes without incident. She was provided with Vaccine Information Sheet and instruction to access the V-Safe system.   [name] was instructed to call 911 with any severe reactions post vaccine: Marland Kitchen Difficulty breathing  . Swelling of face and throat  . A fast heartbeat  . A bad rash all over body  . Dizziness and weakness   Immunizations Administered    Name Date Dose VIS Date Route   Pfizer COVID-19 Vaccine 09/18/2019 12:36 PM 0.3 mL 06/26/2019 Intramuscular   Manufacturer: [employer], [employer]   Lot: [id number]   NDC: [address]-3

## 2019-10-22 DIAGNOSIS — H02831 Dermatochalasis of right upper eyelid: Secondary | ICD-10-CM | POA: Diagnosis not present

## 2019-10-22 DIAGNOSIS — H52203 Unspecified astigmatism, bilateral: Secondary | ICD-10-CM | POA: Diagnosis not present

## 2019-10-22 DIAGNOSIS — H25813 Combined forms of age-related cataract, bilateral: Secondary | ICD-10-CM | POA: Diagnosis not present

## 2019-10-22 DIAGNOSIS — H02834 Dermatochalasis of left upper eyelid: Secondary | ICD-10-CM | POA: Diagnosis not present

## 2019-11-10 DIAGNOSIS — E038 Other specified hypothyroidism: Secondary | ICD-10-CM | POA: Diagnosis not present

## 2019-11-10 DIAGNOSIS — M810 Age-related osteoporosis without current pathological fracture: Secondary | ICD-10-CM | POA: Diagnosis not present

## 2019-11-10 DIAGNOSIS — Z0000 Encounter for general adult medical examination without abnormal findings: Secondary | ICD-10-CM | POA: Diagnosis not present

## 2019-11-10 DIAGNOSIS — E7800 Pure hypercholesterolemia, unspecified: Secondary | ICD-10-CM | POA: Diagnosis not present

## 2019-11-17 DIAGNOSIS — M4854XD Collapsed vertebra, not elsewhere classified, thoracic region, subsequent encounter for fracture with routine healing: Secondary | ICD-10-CM | POA: Diagnosis not present

## 2019-11-17 DIAGNOSIS — M810 Age-related osteoporosis without current pathological fracture: Secondary | ICD-10-CM | POA: Diagnosis not present

## 2019-11-17 DIAGNOSIS — E039 Hypothyroidism, unspecified: Secondary | ICD-10-CM | POA: Diagnosis not present

## 2019-11-17 DIAGNOSIS — Z0000 Encounter for general adult medical examination without abnormal findings: Secondary | ICD-10-CM | POA: Diagnosis not present

## 2019-11-17 DIAGNOSIS — R69 Illness, unspecified: Secondary | ICD-10-CM | POA: Diagnosis not present

## 2019-11-17 DIAGNOSIS — E7800 Pure hypercholesterolemia, unspecified: Secondary | ICD-10-CM | POA: Diagnosis not present

## 2019-11-17 DIAGNOSIS — Z87891 Personal history of nicotine dependence: Secondary | ICD-10-CM | POA: Diagnosis not present

## 2019-11-17 DIAGNOSIS — G4700 Insomnia, unspecified: Secondary | ICD-10-CM | POA: Diagnosis not present

## 2019-11-17 DIAGNOSIS — R82998 Other abnormal findings in urine: Secondary | ICD-10-CM | POA: Diagnosis not present

## 2019-11-17 DIAGNOSIS — Z9114 Patient's other noncompliance with medication regimen: Secondary | ICD-10-CM | POA: Diagnosis not present

## 2019-11-17 DIAGNOSIS — E559 Vitamin D deficiency, unspecified: Secondary | ICD-10-CM | POA: Diagnosis not present

## 2019-11-20 DIAGNOSIS — Z1212 Encounter for screening for malignant neoplasm of rectum: Secondary | ICD-10-CM | POA: Diagnosis not present

## 2019-11-25 ENCOUNTER — Other Ambulatory Visit: Payer: Self-pay | Admitting: Internal Medicine

## 2019-11-25 DIAGNOSIS — Z1231 Encounter for screening mammogram for malignant neoplasm of breast: Secondary | ICD-10-CM

## 2019-12-09 ENCOUNTER — Ambulatory Visit
Admission: RE | Admit: 2019-12-09 | Discharge: 2019-12-09 | Disposition: A | Discharge status: Home / Self Care | Payer: Medicare HMO | Source: Ambulatory Visit | Attending: Internal Medicine | Admitting: Internal Medicine

## 2019-12-09 ENCOUNTER — Other Ambulatory Visit: Payer: Self-pay

## 2019-12-09 DIAGNOSIS — Z1231 Encounter for screening mammogram for malignant neoplasm of breast: Secondary | ICD-10-CM

## 2020-01-13 DIAGNOSIS — H02831 Dermatochalasis of right upper eyelid: Secondary | ICD-10-CM | POA: Diagnosis not present

## 2020-01-13 DIAGNOSIS — H02054 Trichiasis without entropian left upper eyelid: Secondary | ICD-10-CM | POA: Diagnosis not present

## 2020-01-13 DIAGNOSIS — E0500 Thyrotoxicosis with diffuse goiter without thyrotoxic crisis or storm: Secondary | ICD-10-CM | POA: Diagnosis not present

## 2020-01-13 DIAGNOSIS — H04123 Dry eye syndrome of bilateral lacrimal glands: Secondary | ICD-10-CM | POA: Diagnosis not present

## 2020-01-13 DIAGNOSIS — H02834 Dermatochalasis of left upper eyelid: Secondary | ICD-10-CM | POA: Diagnosis not present

## 2020-01-13 DIAGNOSIS — H02051 Trichiasis without entropian right upper eyelid: Secondary | ICD-10-CM | POA: Diagnosis not present

## 2020-01-25 DIAGNOSIS — M1990 Unspecified osteoarthritis, unspecified site: Secondary | ICD-10-CM | POA: Diagnosis not present

## 2020-01-25 DIAGNOSIS — E039 Hypothyroidism, unspecified: Secondary | ICD-10-CM | POA: Diagnosis not present

## 2020-01-25 DIAGNOSIS — E785 Hyperlipidemia, unspecified: Secondary | ICD-10-CM | POA: Diagnosis not present

## 2020-01-25 DIAGNOSIS — R69 Illness, unspecified: Secondary | ICD-10-CM | POA: Diagnosis not present

## 2020-01-25 DIAGNOSIS — F419 Anxiety disorder, unspecified: Secondary | ICD-10-CM | POA: Diagnosis not present

## 2020-01-25 DIAGNOSIS — Z791 Long term (current) use of non-steroidal anti-inflammatories (NSAID): Secondary | ICD-10-CM | POA: Diagnosis not present

## 2020-01-25 DIAGNOSIS — E663 Overweight: Secondary | ICD-10-CM | POA: Diagnosis not present

## 2020-01-25 DIAGNOSIS — R32 Unspecified urinary incontinence: Secondary | ICD-10-CM | POA: Diagnosis not present

## 2020-01-25 DIAGNOSIS — Z6826 Body mass index (BMI) 26.0-26.9, adult: Secondary | ICD-10-CM | POA: Diagnosis not present

## 2020-01-25 DIAGNOSIS — M810 Age-related osteoporosis without current pathological fracture: Secondary | ICD-10-CM | POA: Diagnosis not present

## 2020-09-03 DIAGNOSIS — F419 Anxiety disorder, unspecified: Secondary | ICD-10-CM | POA: Diagnosis not present

## 2020-09-03 DIAGNOSIS — J302 Other seasonal allergic rhinitis: Secondary | ICD-10-CM | POA: Diagnosis not present

## 2020-09-03 DIAGNOSIS — M1990 Unspecified osteoarthritis, unspecified site: Secondary | ICD-10-CM | POA: Diagnosis not present

## 2020-09-03 DIAGNOSIS — E785 Hyperlipidemia, unspecified: Secondary | ICD-10-CM | POA: Diagnosis not present

## 2020-09-03 DIAGNOSIS — E039 Hypothyroidism, unspecified: Secondary | ICD-10-CM | POA: Diagnosis not present

## 2020-09-03 DIAGNOSIS — G4700 Insomnia, unspecified: Secondary | ICD-10-CM | POA: Diagnosis not present

## 2020-09-03 DIAGNOSIS — M792 Neuralgia and neuritis, unspecified: Secondary | ICD-10-CM | POA: Diagnosis not present

## 2020-09-03 DIAGNOSIS — R69 Illness, unspecified: Secondary | ICD-10-CM | POA: Diagnosis not present

## 2020-09-03 DIAGNOSIS — G8929 Other chronic pain: Secondary | ICD-10-CM | POA: Diagnosis not present

## 2020-11-14 DIAGNOSIS — M810 Age-related osteoporosis without current pathological fracture: Secondary | ICD-10-CM | POA: Diagnosis not present

## 2020-11-14 DIAGNOSIS — E7800 Pure hypercholesterolemia, unspecified: Secondary | ICD-10-CM | POA: Diagnosis not present

## 2020-11-14 DIAGNOSIS — E559 Vitamin D deficiency, unspecified: Secondary | ICD-10-CM | POA: Diagnosis not present

## 2020-11-14 DIAGNOSIS — E039 Hypothyroidism, unspecified: Secondary | ICD-10-CM | POA: Diagnosis not present

## 2020-11-18 DIAGNOSIS — Z1331 Encounter for screening for depression: Secondary | ICD-10-CM | POA: Diagnosis not present

## 2020-11-18 DIAGNOSIS — Z1339 Encounter for screening examination for other mental health and behavioral disorders: Secondary | ICD-10-CM | POA: Diagnosis not present

## 2020-11-18 DIAGNOSIS — Z1212 Encounter for screening for malignant neoplasm of rectum: Secondary | ICD-10-CM | POA: Diagnosis not present

## 2020-11-18 DIAGNOSIS — G4700 Insomnia, unspecified: Secondary | ICD-10-CM | POA: Diagnosis not present

## 2020-11-18 DIAGNOSIS — R82998 Other abnormal findings in urine: Secondary | ICD-10-CM | POA: Diagnosis not present

## 2020-11-18 DIAGNOSIS — M4854XD Collapsed vertebra, not elsewhere classified, thoracic region, subsequent encounter for fracture with routine healing: Secondary | ICD-10-CM | POA: Diagnosis not present

## 2020-11-18 DIAGNOSIS — R69 Illness, unspecified: Secondary | ICD-10-CM | POA: Diagnosis not present

## 2020-11-18 DIAGNOSIS — E7800 Pure hypercholesterolemia, unspecified: Secondary | ICD-10-CM | POA: Diagnosis not present

## 2020-11-18 DIAGNOSIS — D692 Other nonthrombocytopenic purpura: Secondary | ICD-10-CM | POA: Diagnosis not present

## 2020-11-18 DIAGNOSIS — E559 Vitamin D deficiency, unspecified: Secondary | ICD-10-CM | POA: Diagnosis not present

## 2020-11-18 DIAGNOSIS — R1310 Dysphagia, unspecified: Secondary | ICD-10-CM | POA: Diagnosis not present

## 2020-11-18 DIAGNOSIS — Z0000 Encounter for general adult medical examination without abnormal findings: Secondary | ICD-10-CM | POA: Diagnosis not present

## 2020-11-18 DIAGNOSIS — E039 Hypothyroidism, unspecified: Secondary | ICD-10-CM | POA: Diagnosis not present

## 2020-11-18 DIAGNOSIS — M810 Age-related osteoporosis without current pathological fracture: Secondary | ICD-10-CM | POA: Diagnosis not present

## 2020-11-21 DIAGNOSIS — H25813 Combined forms of age-related cataract, bilateral: Secondary | ICD-10-CM | POA: Diagnosis not present

## 2020-11-21 DIAGNOSIS — H5203 Hypermetropia, bilateral: Secondary | ICD-10-CM | POA: Diagnosis not present

## 2020-11-21 DIAGNOSIS — H353131 Nonexudative age-related macular degeneration, bilateral, early dry stage: Secondary | ICD-10-CM | POA: Diagnosis not present

## 2020-11-21 DIAGNOSIS — H52203 Unspecified astigmatism, bilateral: Secondary | ICD-10-CM | POA: Diagnosis not present

## 2021-02-09 DIAGNOSIS — H25013 Cortical age-related cataract, bilateral: Secondary | ICD-10-CM | POA: Diagnosis not present

## 2021-02-09 DIAGNOSIS — H2513 Age-related nuclear cataract, bilateral: Secondary | ICD-10-CM | POA: Diagnosis not present

## 2021-02-28 DIAGNOSIS — H25812 Combined forms of age-related cataract, left eye: Secondary | ICD-10-CM | POA: Diagnosis not present

## 2021-02-28 DIAGNOSIS — H2512 Age-related nuclear cataract, left eye: Secondary | ICD-10-CM | POA: Diagnosis not present

## 2021-02-28 DIAGNOSIS — H25012 Cortical age-related cataract, left eye: Secondary | ICD-10-CM | POA: Diagnosis not present

## 2021-03-21 DIAGNOSIS — H25811 Combined forms of age-related cataract, right eye: Secondary | ICD-10-CM | POA: Diagnosis not present

## 2021-03-21 DIAGNOSIS — H2511 Age-related nuclear cataract, right eye: Secondary | ICD-10-CM | POA: Diagnosis not present

## 2021-03-21 DIAGNOSIS — H25011 Cortical age-related cataract, right eye: Secondary | ICD-10-CM | POA: Diagnosis not present

## 2021-11-30 DIAGNOSIS — E559 Vitamin D deficiency, unspecified: Secondary | ICD-10-CM | POA: Diagnosis not present

## 2021-11-30 DIAGNOSIS — E039 Hypothyroidism, unspecified: Secondary | ICD-10-CM | POA: Diagnosis not present

## 2021-11-30 DIAGNOSIS — E7800 Pure hypercholesterolemia, unspecified: Secondary | ICD-10-CM | POA: Diagnosis not present

## 2021-12-06 DIAGNOSIS — E559 Vitamin D deficiency, unspecified: Secondary | ICD-10-CM | POA: Diagnosis not present

## 2021-12-06 DIAGNOSIS — Z1331 Encounter for screening for depression: Secondary | ICD-10-CM | POA: Diagnosis not present

## 2021-12-06 DIAGNOSIS — R82998 Other abnormal findings in urine: Secondary | ICD-10-CM | POA: Diagnosis not present

## 2021-12-06 DIAGNOSIS — E7800 Pure hypercholesterolemia, unspecified: Secondary | ICD-10-CM | POA: Diagnosis not present

## 2021-12-06 DIAGNOSIS — M810 Age-related osteoporosis without current pathological fracture: Secondary | ICD-10-CM | POA: Diagnosis not present

## 2021-12-06 DIAGNOSIS — Z0000 Encounter for general adult medical examination without abnormal findings: Secondary | ICD-10-CM | POA: Diagnosis not present

## 2021-12-06 DIAGNOSIS — F331 Major depressive disorder, recurrent, moderate: Secondary | ICD-10-CM | POA: Diagnosis not present

## 2021-12-06 DIAGNOSIS — Z1339 Encounter for screening examination for other mental health and behavioral disorders: Secondary | ICD-10-CM | POA: Diagnosis not present

## 2021-12-06 DIAGNOSIS — Z87891 Personal history of nicotine dependence: Secondary | ICD-10-CM | POA: Diagnosis not present

## 2021-12-06 DIAGNOSIS — M4854XD Collapsed vertebra, not elsewhere classified, thoracic region, subsequent encounter for fracture with routine healing: Secondary | ICD-10-CM | POA: Diagnosis not present

## 2021-12-06 DIAGNOSIS — E039 Hypothyroidism, unspecified: Secondary | ICD-10-CM | POA: Diagnosis not present

## 2021-12-06 DIAGNOSIS — R69 Illness, unspecified: Secondary | ICD-10-CM | POA: Diagnosis not present

## 2021-12-06 DIAGNOSIS — D692 Other nonthrombocytopenic purpura: Secondary | ICD-10-CM | POA: Diagnosis not present

## 2022-04-17 DIAGNOSIS — E039 Hypothyroidism, unspecified: Secondary | ICD-10-CM | POA: Diagnosis not present

## 2022-04-17 DIAGNOSIS — G621 Alcoholic polyneuropathy: Secondary | ICD-10-CM | POA: Diagnosis not present

## 2022-04-17 DIAGNOSIS — I739 Peripheral vascular disease, unspecified: Secondary | ICD-10-CM | POA: Diagnosis not present

## 2022-04-17 DIAGNOSIS — F325 Major depressive disorder, single episode, in full remission: Secondary | ICD-10-CM | POA: Diagnosis not present

## 2022-04-17 DIAGNOSIS — M810 Age-related osteoporosis without current pathological fracture: Secondary | ICD-10-CM | POA: Diagnosis not present

## 2022-04-17 DIAGNOSIS — Z008 Encounter for other general examination: Secondary | ICD-10-CM | POA: Diagnosis not present

## 2022-04-17 DIAGNOSIS — F419 Anxiety disorder, unspecified: Secondary | ICD-10-CM | POA: Diagnosis not present

## 2022-04-17 DIAGNOSIS — F1021 Alcohol dependence, in remission: Secondary | ICD-10-CM | POA: Diagnosis not present

## 2022-04-17 DIAGNOSIS — G4700 Insomnia, unspecified: Secondary | ICD-10-CM | POA: Diagnosis not present

## 2022-04-17 DIAGNOSIS — M1990 Unspecified osteoarthritis, unspecified site: Secondary | ICD-10-CM | POA: Diagnosis not present

## 2022-04-17 DIAGNOSIS — R69 Illness, unspecified: Secondary | ICD-10-CM | POA: Diagnosis not present

## 2022-04-17 DIAGNOSIS — E785 Hyperlipidemia, unspecified: Secondary | ICD-10-CM | POA: Diagnosis not present

## 2022-04-17 DIAGNOSIS — D696 Thrombocytopenia, unspecified: Secondary | ICD-10-CM | POA: Diagnosis not present

## 2022-04-17 DIAGNOSIS — K08409 Partial loss of teeth, unspecified cause, unspecified class: Secondary | ICD-10-CM | POA: Diagnosis not present

## 2022-04-27 DIAGNOSIS — H5203 Hypermetropia, bilateral: Secondary | ICD-10-CM | POA: Diagnosis not present

## 2022-04-27 DIAGNOSIS — Z961 Presence of intraocular lens: Secondary | ICD-10-CM | POA: Diagnosis not present

## 2022-04-27 DIAGNOSIS — H353132 Nonexudative age-related macular degeneration, bilateral, intermediate dry stage: Secondary | ICD-10-CM | POA: Diagnosis not present

## 2022-04-27 DIAGNOSIS — H26493 Other secondary cataract, bilateral: Secondary | ICD-10-CM | POA: Diagnosis not present

## 2022-05-24 DIAGNOSIS — H26492 Other secondary cataract, left eye: Secondary | ICD-10-CM | POA: Diagnosis not present

## 2022-05-24 DIAGNOSIS — H26491 Other secondary cataract, right eye: Secondary | ICD-10-CM | POA: Diagnosis not present

## 2022-10-31 DIAGNOSIS — H353132 Nonexudative age-related macular degeneration, bilateral, intermediate dry stage: Secondary | ICD-10-CM | POA: Diagnosis not present

## 2022-10-31 DIAGNOSIS — Z961 Presence of intraocular lens: Secondary | ICD-10-CM | POA: Diagnosis not present
